# Patient Record
Sex: Female | Born: 1935 | Race: Black or African American | Hispanic: No | Marital: Single | Smoking: Never smoker
Health system: Southern US, Community
[De-identification: ages and names within clinical notes are randomized; demographics above are authoritative.]

## PROBLEM LIST (undated history)

## (undated) DIAGNOSIS — I10 Essential (primary) hypertension: Secondary | ICD-10-CM

## (undated) DIAGNOSIS — H409 Unspecified glaucoma: Secondary | ICD-10-CM

## (undated) DIAGNOSIS — K589 Irritable bowel syndrome without diarrhea: Secondary | ICD-10-CM

## (undated) DIAGNOSIS — F419 Anxiety disorder, unspecified: Secondary | ICD-10-CM

## (undated) DIAGNOSIS — D649 Anemia, unspecified: Secondary | ICD-10-CM

## (undated) DIAGNOSIS — I6990 Unspecified sequelae of unspecified cerebrovascular disease: Secondary | ICD-10-CM

## (undated) DIAGNOSIS — K210 Gastro-esophageal reflux disease with esophagitis: Secondary | ICD-10-CM

## (undated) DIAGNOSIS — J209 Acute bronchitis, unspecified: Secondary | ICD-10-CM

## (undated) DIAGNOSIS — W19XXXA Unspecified fall, initial encounter: Secondary | ICD-10-CM

## (undated) DIAGNOSIS — E559 Vitamin D deficiency, unspecified: Secondary | ICD-10-CM

## (undated) DIAGNOSIS — L97509 Non-pressure chronic ulcer of other part of unspecified foot with unspecified severity: Secondary | ICD-10-CM

## (undated) DIAGNOSIS — R627 Adult failure to thrive: Secondary | ICD-10-CM

## (undated) DIAGNOSIS — IMO0002 Reserved for concepts with insufficient information to code with codable children: Secondary | ICD-10-CM

## (undated) DIAGNOSIS — G20 Parkinson's disease: Secondary | ICD-10-CM

## (undated) DIAGNOSIS — F0150 Vascular dementia without behavioral disturbance: Secondary | ICD-10-CM

## (undated) DIAGNOSIS — E119 Type 2 diabetes mellitus without complications: Secondary | ICD-10-CM

## (undated) DIAGNOSIS — R0602 Shortness of breath: Secondary | ICD-10-CM

## (undated) HISTORY — DX: Type 2 diabetes mellitus without complications: E11.9

## (undated) HISTORY — DX: Anxiety disorder, unspecified: F41.9

## (undated) HISTORY — DX: Shortness of breath: R06.02

## (undated) HISTORY — DX: Adult failure to thrive: R62.7

## (undated) HISTORY — DX: Parkinson's disease: G20

## (undated) HISTORY — DX: Essential (primary) hypertension: I10

## (undated) HISTORY — DX: Irritable bowel syndrome without diarrhea: K58.9

## (undated) HISTORY — DX: Vitamin D deficiency, unspecified: E55.9

## (undated) HISTORY — DX: Reserved for concepts with insufficient information to code with codable children: IMO0002

## (undated) HISTORY — DX: Vascular dementia without behavioral disturbance: F01.50

## (undated) HISTORY — DX: Acute bronchitis, unspecified: J20.9

## (undated) HISTORY — DX: Unspecified sequelae of unspecified cerebrovascular disease: I69.90

## (undated) HISTORY — DX: Gastro-esophageal reflux disease with esophagitis: K21.0

## (undated) HISTORY — DX: Non-pressure chronic ulcer of other part of unspecified foot with unspecified severity: L97.509

## (undated) HISTORY — DX: Unspecified fall, initial encounter: W19.XXXA

## (undated) HISTORY — DX: Unspecified glaucoma: H40.9

## (undated) HISTORY — DX: Anemia, unspecified: D64.9

---

## 1998-03-20 ENCOUNTER — Ambulatory Visit (HOSPITAL_COMMUNITY): Admission: RE | Admit: 1998-03-20 | Discharge: 1998-03-20 | Payer: Self-pay | Admitting: Specialist

## 1998-06-22 ENCOUNTER — Other Ambulatory Visit: Admission: RE | Admit: 1998-06-22 | Discharge: 1998-06-22 | Payer: Self-pay | Admitting: Obstetrics and Gynecology

## 1999-04-09 ENCOUNTER — Encounter: Payer: Self-pay | Admitting: Emergency Medicine

## 1999-04-09 ENCOUNTER — Inpatient Hospital Stay (HOSPITAL_COMMUNITY): Admission: EM | Admit: 1999-04-09 | Discharge: 1999-04-12 | Payer: Self-pay | Admitting: Emergency Medicine

## 1999-10-09 ENCOUNTER — Encounter: Payer: Self-pay | Admitting: Physical Medicine & Rehabilitation

## 1999-10-09 ENCOUNTER — Ambulatory Visit (HOSPITAL_COMMUNITY)
Admission: RE | Admit: 1999-10-09 | Discharge: 1999-10-09 | Payer: Self-pay | Admitting: Physical Medicine & Rehabilitation

## 1999-11-07 ENCOUNTER — Encounter
Admission: RE | Admit: 1999-11-07 | Discharge: 2000-02-05 | Payer: Self-pay | Admitting: Physical Medicine & Rehabilitation

## 2000-08-15 ENCOUNTER — Encounter: Payer: Self-pay | Admitting: Physical Medicine & Rehabilitation

## 2000-08-15 ENCOUNTER — Ambulatory Visit (HOSPITAL_COMMUNITY)
Admission: RE | Admit: 2000-08-15 | Discharge: 2000-08-15 | Payer: Self-pay | Admitting: Physical Medicine & Rehabilitation

## 2000-11-05 ENCOUNTER — Other Ambulatory Visit: Admission: RE | Admit: 2000-11-05 | Discharge: 2000-11-05 | Payer: Self-pay | Admitting: *Deleted

## 2001-02-06 ENCOUNTER — Other Ambulatory Visit: Admission: RE | Admit: 2001-02-06 | Discharge: 2001-02-06 | Payer: Self-pay | Admitting: *Deleted

## 2001-12-07 ENCOUNTER — Encounter: Payer: Self-pay | Admitting: *Deleted

## 2001-12-07 ENCOUNTER — Emergency Department (HOSPITAL_COMMUNITY): Admission: EM | Admit: 2001-12-07 | Discharge: 2001-12-07 | Payer: Self-pay | Admitting: Emergency Medicine

## 2002-01-08 ENCOUNTER — Encounter
Admission: RE | Admit: 2002-01-08 | Discharge: 2002-02-01 | Payer: Self-pay | Admitting: Physical Medicine & Rehabilitation

## 2002-05-04 ENCOUNTER — Ambulatory Visit (HOSPITAL_COMMUNITY): Admission: RE | Admit: 2002-05-04 | Discharge: 2002-05-04 | Payer: Self-pay | Admitting: Pulmonary Disease

## 2002-05-04 ENCOUNTER — Encounter: Payer: Self-pay | Admitting: Pulmonary Disease

## 2002-06-10 ENCOUNTER — Inpatient Hospital Stay (HOSPITAL_COMMUNITY): Admission: EM | Admit: 2002-06-10 | Discharge: 2002-06-11 | Payer: Self-pay | Admitting: Emergency Medicine

## 2002-06-10 ENCOUNTER — Encounter: Payer: Self-pay | Admitting: Endocrinology

## 2002-06-11 ENCOUNTER — Encounter: Payer: Self-pay | Admitting: Endocrinology

## 2003-03-10 ENCOUNTER — Encounter
Admission: RE | Admit: 2003-03-10 | Discharge: 2003-06-08 | Payer: Self-pay | Admitting: Physical Medicine & Rehabilitation

## 2004-03-15 ENCOUNTER — Encounter (HOSPITAL_BASED_OUTPATIENT_CLINIC_OR_DEPARTMENT_OTHER): Admission: RE | Admit: 2004-03-15 | Discharge: 2004-06-13 | Payer: Self-pay | Admitting: Internal Medicine

## 2004-07-31 ENCOUNTER — Ambulatory Visit (HOSPITAL_COMMUNITY): Admission: RE | Admit: 2004-07-31 | Discharge: 2004-07-31 | Payer: Self-pay | Admitting: Pulmonary Disease

## 2004-09-25 ENCOUNTER — Encounter
Admission: RE | Admit: 2004-09-25 | Discharge: 2004-12-24 | Payer: Self-pay | Admitting: Physical Medicine & Rehabilitation

## 2004-09-26 ENCOUNTER — Ambulatory Visit: Payer: Self-pay | Admitting: Physical Medicine & Rehabilitation

## 2004-10-24 ENCOUNTER — Ambulatory Visit: Payer: Self-pay | Admitting: Pulmonary Disease

## 2004-11-12 ENCOUNTER — Ambulatory Visit: Payer: Self-pay | Admitting: Physical Medicine & Rehabilitation

## 2004-11-27 ENCOUNTER — Encounter: Admission: RE | Admit: 2004-11-27 | Discharge: 2004-11-27 | Payer: Self-pay | Admitting: Neurology

## 2005-02-07 ENCOUNTER — Encounter
Admission: RE | Admit: 2005-02-07 | Discharge: 2005-05-08 | Payer: Self-pay | Admitting: Physical Medicine & Rehabilitation

## 2005-02-11 ENCOUNTER — Ambulatory Visit: Payer: Self-pay | Admitting: Physical Medicine & Rehabilitation

## 2005-02-18 ENCOUNTER — Ambulatory Visit: Payer: Self-pay | Admitting: Pulmonary Disease

## 2005-08-01 ENCOUNTER — Encounter
Admission: RE | Admit: 2005-08-01 | Discharge: 2005-10-30 | Payer: Self-pay | Admitting: Physical Medicine & Rehabilitation

## 2005-08-20 ENCOUNTER — Ambulatory Visit: Payer: Self-pay | Admitting: Pulmonary Disease

## 2005-08-28 ENCOUNTER — Ambulatory Visit: Payer: Self-pay | Admitting: Physical Medicine & Rehabilitation

## 2005-11-11 ENCOUNTER — Ambulatory Visit: Payer: Self-pay | Admitting: Physical Medicine & Rehabilitation

## 2005-11-11 ENCOUNTER — Encounter
Admission: RE | Admit: 2005-11-11 | Discharge: 2006-02-09 | Payer: Self-pay | Admitting: Physical Medicine & Rehabilitation

## 2005-12-04 ENCOUNTER — Ambulatory Visit: Payer: Self-pay | Admitting: Pulmonary Disease

## 2006-02-25 ENCOUNTER — Ambulatory Visit: Payer: Self-pay | Admitting: Pulmonary Disease

## 2006-02-27 ENCOUNTER — Ambulatory Visit: Payer: Self-pay | Admitting: Physical Medicine & Rehabilitation

## 2006-02-27 ENCOUNTER — Encounter
Admission: RE | Admit: 2006-02-27 | Discharge: 2006-05-28 | Payer: Self-pay | Admitting: Physical Medicine & Rehabilitation

## 2006-05-29 ENCOUNTER — Encounter
Admission: RE | Admit: 2006-05-29 | Discharge: 2006-08-27 | Payer: Self-pay | Admitting: Physical Medicine & Rehabilitation

## 2006-05-29 ENCOUNTER — Ambulatory Visit: Payer: Self-pay | Admitting: Physical Medicine & Rehabilitation

## 2006-06-27 ENCOUNTER — Ambulatory Visit: Payer: Self-pay | Admitting: Pulmonary Disease

## 2006-08-10 ENCOUNTER — Emergency Department (HOSPITAL_COMMUNITY): Admission: EM | Admit: 2006-08-10 | Discharge: 2006-08-10 | Payer: Self-pay | Admitting: Emergency Medicine

## 2006-08-31 ENCOUNTER — Emergency Department (HOSPITAL_COMMUNITY): Admission: EM | Admit: 2006-08-31 | Discharge: 2006-08-31 | Payer: Self-pay | Admitting: Emergency Medicine

## 2006-09-08 ENCOUNTER — Ambulatory Visit: Payer: Self-pay | Admitting: Pulmonary Disease

## 2006-09-24 ENCOUNTER — Ambulatory Visit: Payer: Self-pay | Admitting: Physical Medicine & Rehabilitation

## 2006-09-24 ENCOUNTER — Encounter
Admission: RE | Admit: 2006-09-24 | Discharge: 2006-12-23 | Payer: Self-pay | Admitting: Physical Medicine & Rehabilitation

## 2006-10-10 ENCOUNTER — Ambulatory Visit: Payer: Self-pay | Admitting: Pulmonary Disease

## 2006-11-05 ENCOUNTER — Ambulatory Visit: Payer: Self-pay | Admitting: Pulmonary Disease

## 2006-11-07 ENCOUNTER — Encounter (HOSPITAL_BASED_OUTPATIENT_CLINIC_OR_DEPARTMENT_OTHER): Admission: RE | Admit: 2006-11-07 | Discharge: 2007-02-05 | Payer: Self-pay | Admitting: Surgery

## 2007-01-02 ENCOUNTER — Ambulatory Visit: Payer: Self-pay | Admitting: Vascular Surgery

## 2007-02-10 ENCOUNTER — Encounter (HOSPITAL_BASED_OUTPATIENT_CLINIC_OR_DEPARTMENT_OTHER): Admission: RE | Admit: 2007-02-10 | Discharge: 2007-05-11 | Payer: Self-pay | Admitting: Surgery

## 2007-04-01 ENCOUNTER — Ambulatory Visit (HOSPITAL_COMMUNITY): Admission: RE | Admit: 2007-04-01 | Discharge: 2007-04-01 | Payer: Self-pay | Admitting: Surgery

## 2007-05-12 ENCOUNTER — Encounter (HOSPITAL_BASED_OUTPATIENT_CLINIC_OR_DEPARTMENT_OTHER): Admission: RE | Admit: 2007-05-12 | Discharge: 2007-06-10 | Payer: Self-pay | Admitting: Surgery

## 2007-06-10 ENCOUNTER — Encounter (HOSPITAL_BASED_OUTPATIENT_CLINIC_OR_DEPARTMENT_OTHER): Admission: RE | Admit: 2007-06-10 | Discharge: 2007-08-04 | Payer: Self-pay | Admitting: Surgery

## 2007-08-17 ENCOUNTER — Encounter (HOSPITAL_BASED_OUTPATIENT_CLINIC_OR_DEPARTMENT_OTHER): Admission: RE | Admit: 2007-08-17 | Discharge: 2007-09-21 | Payer: Self-pay | Admitting: Surgery

## 2007-09-16 ENCOUNTER — Encounter: Payer: Self-pay | Admitting: Pulmonary Disease

## 2007-10-07 ENCOUNTER — Telehealth (INDEPENDENT_AMBULATORY_CARE_PROVIDER_SITE_OTHER): Payer: Self-pay | Admitting: *Deleted

## 2007-10-12 ENCOUNTER — Ambulatory Visit: Payer: Self-pay | Admitting: Pulmonary Disease

## 2007-10-12 DIAGNOSIS — K589 Irritable bowel syndrome without diarrhea: Secondary | ICD-10-CM | POA: Insufficient documentation

## 2007-10-12 DIAGNOSIS — I10 Essential (primary) hypertension: Secondary | ICD-10-CM

## 2007-10-12 DIAGNOSIS — E119 Type 2 diabetes mellitus without complications: Secondary | ICD-10-CM

## 2007-10-12 DIAGNOSIS — K219 Gastro-esophageal reflux disease without esophagitis: Secondary | ICD-10-CM

## 2007-10-12 DIAGNOSIS — M545 Low back pain, unspecified: Secondary | ICD-10-CM | POA: Insufficient documentation

## 2007-10-12 DIAGNOSIS — M1990 Unspecified osteoarthritis, unspecified site: Secondary | ICD-10-CM | POA: Insufficient documentation

## 2007-10-12 DIAGNOSIS — F411 Generalized anxiety disorder: Secondary | ICD-10-CM | POA: Insufficient documentation

## 2007-10-12 DIAGNOSIS — I872 Venous insufficiency (chronic) (peripheral): Secondary | ICD-10-CM | POA: Insufficient documentation

## 2007-10-19 LAB — CONVERTED CEMR LAB
BUN: 12 mg/dL (ref 6–23)
CO2: 28 meq/L (ref 19–32)
Calcium: 9.4 mg/dL (ref 8.4–10.5)
Chloride: 102 meq/L (ref 96–112)
Creatinine, Ser: 0.6 mg/dL (ref 0.4–1.2)
Hgb A1c MFr Bld: 7 % — ABNORMAL HIGH (ref 4.6–6.0)

## 2007-11-13 ENCOUNTER — Ambulatory Visit: Payer: Self-pay | Admitting: Pulmonary Disease

## 2007-11-13 DIAGNOSIS — E1169 Type 2 diabetes mellitus with other specified complication: Secondary | ICD-10-CM | POA: Insufficient documentation

## 2007-11-13 DIAGNOSIS — G20 Parkinson's disease: Secondary | ICD-10-CM

## 2007-11-13 DIAGNOSIS — E669 Obesity, unspecified: Secondary | ICD-10-CM

## 2007-11-13 DIAGNOSIS — N390 Urinary tract infection, site not specified: Secondary | ICD-10-CM

## 2007-11-13 LAB — CONVERTED CEMR LAB
ALT: 9 units/L (ref 0–35)
AST: 20 units/L (ref 0–37)
Basophils Relative: 0.5 % (ref 0.0–1.0)
Bilirubin, Direct: 0.1 mg/dL (ref 0.0–0.3)
CO2: 28 meq/L (ref 19–32)
Eosinophils Absolute: 0.2 10*3/uL (ref 0.0–0.6)
Eosinophils Relative: 2.6 % (ref 0.0–5.0)
GFR calc Af Amer: 123 mL/min
GFR calc non Af Amer: 101 mL/min
Glucose, Bld: 201 mg/dL — ABNORMAL HIGH (ref 70–99)
HCT: 35.2 % — ABNORMAL LOW (ref 36.0–46.0)
Hemoglobin: 12.4 g/dL (ref 12.0–15.0)
Hgb A1c MFr Bld: 6.9 % — ABNORMAL HIGH (ref 4.6–6.0)
Lymphocytes Relative: 34 % (ref 12.0–46.0)
MCV: 99.7 fL (ref 78.0–100.0)
Neutro Abs: 3.1 10*3/uL (ref 1.4–7.7)
Neutrophils Relative %: 54.8 % (ref 43.0–77.0)
Sodium: 138 meq/L (ref 135–145)
TSH: 0.95 microintl units/mL (ref 0.35–5.50)
Total Protein: 6.7 g/dL (ref 6.0–8.3)
WBC: 5.8 10*3/uL (ref 4.5–10.5)

## 2007-11-15 ENCOUNTER — Emergency Department (HOSPITAL_COMMUNITY): Admission: EM | Admit: 2007-11-15 | Discharge: 2007-11-15 | Payer: Self-pay | Admitting: Emergency Medicine

## 2007-11-17 ENCOUNTER — Ambulatory Visit: Payer: Self-pay | Admitting: Pulmonary Disease

## 2007-11-17 ENCOUNTER — Encounter (INDEPENDENT_AMBULATORY_CARE_PROVIDER_SITE_OTHER): Payer: Self-pay | Admitting: *Deleted

## 2007-11-18 ENCOUNTER — Encounter: Payer: Self-pay | Admitting: Pulmonary Disease

## 2007-11-19 ENCOUNTER — Ambulatory Visit (HOSPITAL_COMMUNITY): Admission: RE | Admit: 2007-11-19 | Discharge: 2007-11-19 | Payer: Self-pay | Admitting: Pulmonary Disease

## 2007-11-27 ENCOUNTER — Telehealth: Payer: Self-pay | Admitting: Pulmonary Disease

## 2007-12-01 ENCOUNTER — Telehealth (INDEPENDENT_AMBULATORY_CARE_PROVIDER_SITE_OTHER): Payer: Self-pay | Admitting: *Deleted

## 2007-12-08 ENCOUNTER — Ambulatory Visit: Payer: Self-pay | Admitting: Gastroenterology

## 2007-12-08 ENCOUNTER — Encounter: Payer: Self-pay | Admitting: Pulmonary Disease

## 2007-12-08 LAB — CONVERTED CEMR LAB
Albumin: 3.8 g/dL (ref 3.5–5.2)
Alkaline Phosphatase: 75 units/L (ref 39–117)
BUN: 17 mg/dL (ref 6–23)
Eosinophils Absolute: 0.1 10*3/uL (ref 0.0–0.6)
Eosinophils Relative: 1.9 % (ref 0.0–5.0)
Ferritin: 37.8 ng/mL (ref 10.0–291.0)
Folate: 7.8 ng/mL
GFR calc Af Amer: 88 mL/min
GFR calc non Af Amer: 73 mL/min
Iron: 62 ug/dL (ref 42–145)
Lymphocytes Relative: 35.5 % (ref 12.0–46.0)
MCV: 100.6 fL — ABNORMAL HIGH (ref 78.0–100.0)
Monocytes Relative: 7.3 % (ref 3.0–11.0)
Neutro Abs: 3 10*3/uL (ref 1.4–7.7)
Platelets: 220 10*3/uL (ref 150–400)
Potassium: 4 meq/L (ref 3.5–5.1)
RBC: 3.64 M/uL — ABNORMAL LOW (ref 3.87–5.11)
Saturation Ratios: 21.4 % (ref 20.0–50.0)
Sodium: 141 meq/L (ref 135–145)
TSH: 1.08 microintl units/mL (ref 0.35–5.50)
Tissue Transglutaminase Ab, IgA: 1.3 units (ref <7)
Total Bilirubin: 0.9 mg/dL (ref 0.3–1.2)
Transferrin: 206.6 mg/dL — ABNORMAL LOW (ref >=212.0)
Vitamin B-12: 347 pg/mL (ref 211–911)
WBC: 5.4 10*3/uL (ref 4.5–10.5)

## 2007-12-14 ENCOUNTER — Ambulatory Visit: Payer: Self-pay | Admitting: Pulmonary Disease

## 2007-12-18 ENCOUNTER — Ambulatory Visit: Payer: Self-pay | Admitting: Physical Medicine & Rehabilitation

## 2007-12-18 ENCOUNTER — Encounter
Admission: RE | Admit: 2007-12-18 | Discharge: 2008-01-11 | Payer: Self-pay | Admitting: Physical Medicine & Rehabilitation

## 2008-01-12 ENCOUNTER — Encounter: Payer: Self-pay | Admitting: Pulmonary Disease

## 2008-02-09 ENCOUNTER — Encounter: Payer: Self-pay | Admitting: Pulmonary Disease

## 2008-02-15 ENCOUNTER — Ambulatory Visit: Payer: Self-pay | Admitting: Pulmonary Disease

## 2008-03-09 ENCOUNTER — Telehealth (INDEPENDENT_AMBULATORY_CARE_PROVIDER_SITE_OTHER): Payer: Self-pay | Admitting: *Deleted

## 2008-04-01 ENCOUNTER — Encounter
Admission: RE | Admit: 2008-04-01 | Discharge: 2008-04-04 | Payer: Self-pay | Admitting: Physical Medicine & Rehabilitation

## 2008-04-04 ENCOUNTER — Ambulatory Visit: Payer: Self-pay | Admitting: Physical Medicine & Rehabilitation

## 2008-04-11 ENCOUNTER — Telehealth: Payer: Self-pay | Admitting: Adult Health

## 2008-05-16 ENCOUNTER — Telehealth (INDEPENDENT_AMBULATORY_CARE_PROVIDER_SITE_OTHER): Payer: Self-pay | Admitting: *Deleted

## 2008-05-19 ENCOUNTER — Telehealth: Payer: Self-pay | Admitting: Pulmonary Disease

## 2008-05-23 ENCOUNTER — Ambulatory Visit: Payer: Self-pay | Admitting: Pulmonary Disease

## 2008-05-26 ENCOUNTER — Encounter: Payer: Self-pay | Admitting: Pulmonary Disease

## 2008-05-26 LAB — CONVERTED CEMR LAB
Albumin: 3.7 g/dL (ref 3.5–5.2)
BUN: 18 mg/dL (ref 6–23)
Basophils Absolute: 0.1 10*3/uL (ref 0.0–0.1)
Basophils Relative: 0.9 % (ref 0.0–3.0)
Bilirubin Urine: NEGATIVE
Creatinine, Ser: 0.6 mg/dL (ref 0.4–1.2)
Creatinine,U: 24 mg/dL
Crystals: NEGATIVE
Eosinophils Absolute: 0 10*3/uL (ref 0.0–0.7)
GFR calc Af Amer: 122 mL/min
GFR calc non Af Amer: 101 mL/min
Hgb A1c MFr Bld: 6.4 % — ABNORMAL HIGH (ref 4.6–6.0)
Ketones, ur: NEGATIVE mg/dL
Leukocytes, UA: NEGATIVE
MCHC: 33.7 g/dL (ref 30.0–36.0)
MCV: 103.1 fL — ABNORMAL HIGH (ref 78.0–100.0)
Microalb Creat Ratio: 37.5 mg/g — ABNORMAL HIGH (ref 0.0–30.0)
Microalb, Ur: 0.9 mg/dL (ref 0.0–1.9)
Neutrophils Relative %: 67 % (ref 43.0–77.0)
Nitrite: NEGATIVE
RBC / HPF: NONE SEEN
RBC: 3.53 M/uL — ABNORMAL LOW (ref 3.87–5.11)
RDW: 11.7 % (ref 11.5–14.6)
Specific Gravity, Urine: 1.01 (ref 1.000–1.03)
Urobilinogen, UA: 0.2 (ref 0.0–1.0)

## 2008-06-07 ENCOUNTER — Telehealth (INDEPENDENT_AMBULATORY_CARE_PROVIDER_SITE_OTHER): Payer: Self-pay | Admitting: *Deleted

## 2008-06-08 ENCOUNTER — Encounter: Payer: Self-pay | Admitting: Pulmonary Disease

## 2008-06-22 ENCOUNTER — Telehealth (INDEPENDENT_AMBULATORY_CARE_PROVIDER_SITE_OTHER): Payer: Self-pay | Admitting: *Deleted

## 2008-06-27 ENCOUNTER — Telehealth (INDEPENDENT_AMBULATORY_CARE_PROVIDER_SITE_OTHER): Payer: Self-pay | Admitting: *Deleted

## 2008-07-12 ENCOUNTER — Telehealth (INDEPENDENT_AMBULATORY_CARE_PROVIDER_SITE_OTHER): Payer: Self-pay | Admitting: *Deleted

## 2008-07-13 ENCOUNTER — Encounter: Payer: Self-pay | Admitting: Pulmonary Disease

## 2008-07-18 ENCOUNTER — Encounter: Payer: Self-pay | Admitting: Pulmonary Disease

## 2008-08-05 ENCOUNTER — Telehealth: Payer: Self-pay | Admitting: Pulmonary Disease

## 2008-08-25 ENCOUNTER — Telehealth (INDEPENDENT_AMBULATORY_CARE_PROVIDER_SITE_OTHER): Payer: Self-pay | Admitting: *Deleted

## 2008-09-02 ENCOUNTER — Encounter: Payer: Self-pay | Admitting: Pulmonary Disease

## 2008-09-05 ENCOUNTER — Telehealth (INDEPENDENT_AMBULATORY_CARE_PROVIDER_SITE_OTHER): Payer: Self-pay | Admitting: *Deleted

## 2008-09-08 ENCOUNTER — Telehealth (INDEPENDENT_AMBULATORY_CARE_PROVIDER_SITE_OTHER): Payer: Self-pay | Admitting: *Deleted

## 2008-09-16 ENCOUNTER — Encounter
Admission: RE | Admit: 2008-09-16 | Discharge: 2008-09-19 | Payer: Self-pay | Admitting: Physical Medicine & Rehabilitation

## 2008-09-19 ENCOUNTER — Ambulatory Visit: Payer: Self-pay | Admitting: Physical Medicine & Rehabilitation

## 2008-09-22 ENCOUNTER — Telehealth (INDEPENDENT_AMBULATORY_CARE_PROVIDER_SITE_OTHER): Payer: Self-pay | Admitting: *Deleted

## 2008-09-26 ENCOUNTER — Ambulatory Visit: Payer: Self-pay | Admitting: Pulmonary Disease

## 2008-09-26 DIAGNOSIS — E559 Vitamin D deficiency, unspecified: Secondary | ICD-10-CM | POA: Insufficient documentation

## 2008-10-01 LAB — CONVERTED CEMR LAB
Albumin: 3.7 g/dL (ref 3.5–5.2)
Alkaline Phosphatase: 77 units/L (ref 39–117)
BUN: 19 mg/dL (ref 6–23)
Calcium: 9.4 mg/dL (ref 8.4–10.5)
GFR calc Af Amer: 122 mL/min
Glucose, Bld: 110 mg/dL — ABNORMAL HIGH (ref 70–99)
Total Protein: 7.4 g/dL (ref 6.0–8.3)

## 2008-10-04 ENCOUNTER — Telehealth (INDEPENDENT_AMBULATORY_CARE_PROVIDER_SITE_OTHER): Payer: Self-pay | Admitting: *Deleted

## 2008-10-04 ENCOUNTER — Telehealth: Payer: Self-pay | Admitting: Pulmonary Disease

## 2008-10-10 ENCOUNTER — Encounter: Payer: Self-pay | Admitting: Pulmonary Disease

## 2008-10-12 ENCOUNTER — Telehealth: Payer: Self-pay | Admitting: Pulmonary Disease

## 2008-10-12 ENCOUNTER — Emergency Department (HOSPITAL_COMMUNITY): Admission: EM | Admit: 2008-10-12 | Discharge: 2008-10-12 | Payer: Self-pay | Admitting: Emergency Medicine

## 2008-10-12 ENCOUNTER — Inpatient Hospital Stay (HOSPITAL_COMMUNITY): Admission: EM | Admit: 2008-10-12 | Discharge: 2008-10-19 | Payer: Self-pay | Admitting: Emergency Medicine

## 2008-10-12 ENCOUNTER — Ambulatory Visit: Payer: Self-pay | Admitting: Pulmonary Disease

## 2008-10-18 ENCOUNTER — Telehealth: Payer: Self-pay | Admitting: Pulmonary Disease

## 2008-11-03 DIAGNOSIS — G20A1 Parkinson's disease without dyskinesia, without mention of fluctuations: Secondary | ICD-10-CM

## 2008-11-03 DIAGNOSIS — I10 Essential (primary) hypertension: Secondary | ICD-10-CM

## 2008-11-03 DIAGNOSIS — G20 Parkinson's disease: Secondary | ICD-10-CM

## 2008-11-03 DIAGNOSIS — K589 Irritable bowel syndrome without diarrhea: Secondary | ICD-10-CM

## 2008-11-03 HISTORY — DX: Essential (primary) hypertension: I10

## 2008-11-03 HISTORY — DX: Irritable bowel syndrome, unspecified: K58.9

## 2008-11-03 HISTORY — DX: Parkinson's disease without dyskinesia, without mention of fluctuations: G20.A1

## 2008-11-03 HISTORY — DX: Parkinson's disease: G20

## 2008-11-07 ENCOUNTER — Encounter: Payer: Self-pay | Admitting: Pulmonary Disease

## 2008-11-09 ENCOUNTER — Encounter: Payer: Self-pay | Admitting: Pulmonary Disease

## 2008-11-14 ENCOUNTER — Telehealth: Payer: Self-pay | Admitting: Pulmonary Disease

## 2008-11-18 DIAGNOSIS — K2100 Gastro-esophageal reflux disease with esophagitis, without bleeding: Secondary | ICD-10-CM

## 2008-11-18 DIAGNOSIS — I6990 Unspecified sequelae of unspecified cerebrovascular disease: Secondary | ICD-10-CM

## 2008-11-18 DIAGNOSIS — R0602 Shortness of breath: Secondary | ICD-10-CM

## 2008-11-18 DIAGNOSIS — L97509 Non-pressure chronic ulcer of other part of unspecified foot with unspecified severity: Secondary | ICD-10-CM

## 2008-11-18 HISTORY — DX: Gastro-esophageal reflux disease with esophagitis, without bleeding: K21.00

## 2008-11-18 HISTORY — DX: Unspecified sequelae of unspecified cerebrovascular disease: I69.90

## 2008-11-18 HISTORY — DX: Non-pressure chronic ulcer of other part of unspecified foot with unspecified severity: L97.509

## 2008-11-18 HISTORY — DX: Shortness of breath: R06.02

## 2008-11-23 ENCOUNTER — Telehealth (INDEPENDENT_AMBULATORY_CARE_PROVIDER_SITE_OTHER): Payer: Self-pay | Admitting: *Deleted

## 2008-12-06 ENCOUNTER — Telehealth (INDEPENDENT_AMBULATORY_CARE_PROVIDER_SITE_OTHER): Payer: Self-pay | Admitting: *Deleted

## 2008-12-15 ENCOUNTER — Encounter: Payer: Self-pay | Admitting: Pulmonary Disease

## 2009-03-20 ENCOUNTER — Telehealth (INDEPENDENT_AMBULATORY_CARE_PROVIDER_SITE_OTHER): Payer: Self-pay | Admitting: *Deleted

## 2009-09-04 ENCOUNTER — Encounter: Admission: RE | Admit: 2009-09-04 | Discharge: 2009-09-04 | Payer: Self-pay | Admitting: Emergency Medicine

## 2010-01-24 ENCOUNTER — Encounter
Admission: RE | Admit: 2010-01-24 | Discharge: 2010-01-29 | Payer: Self-pay | Admitting: Physical Medicine & Rehabilitation

## 2010-01-29 ENCOUNTER — Ambulatory Visit: Payer: Self-pay | Admitting: Physical Medicine & Rehabilitation

## 2010-02-05 ENCOUNTER — Encounter: Payer: Self-pay | Admitting: Pulmonary Disease

## 2010-03-26 ENCOUNTER — Ambulatory Visit: Payer: Self-pay | Admitting: Surgery

## 2010-08-01 ENCOUNTER — Encounter
Admission: RE | Admit: 2010-08-01 | Discharge: 2010-08-01 | Payer: Self-pay | Source: Home / Self Care | Attending: Physical Medicine & Rehabilitation | Admitting: Physical Medicine & Rehabilitation

## 2010-11-25 ENCOUNTER — Encounter: Payer: Self-pay | Admitting: Pulmonary Disease

## 2010-11-25 ENCOUNTER — Encounter: Payer: Self-pay | Admitting: Neurology

## 2010-12-06 NOTE — Letter (Signed)
Summary: [facility]   Imported By: Sherian Rein 02/13/2010 07:42:29  _____________________________________________________________________  External Attachment:    Type:   Image     Comment:   External Document

## 2011-01-17 ENCOUNTER — Inpatient Hospital Stay (HOSPITAL_COMMUNITY)
Admission: EM | Admit: 2011-01-17 | Discharge: 2011-01-24 | DRG: 057 | Disposition: A | Discharge status: Skilled Nursing Facility | Payer: Medicare Other | Attending: Internal Medicine | Admitting: Internal Medicine

## 2011-01-17 ENCOUNTER — Emergency Department (HOSPITAL_COMMUNITY): Payer: Medicare Other

## 2011-01-17 DIAGNOSIS — W010XXA Fall on same level from slipping, tripping and stumbling without subsequent striking against object, initial encounter: Secondary | ICD-10-CM | POA: Diagnosis present

## 2011-01-17 DIAGNOSIS — E1142 Type 2 diabetes mellitus with diabetic polyneuropathy: Secondary | ICD-10-CM | POA: Diagnosis present

## 2011-01-17 DIAGNOSIS — D649 Anemia, unspecified: Secondary | ICD-10-CM | POA: Diagnosis present

## 2011-01-17 DIAGNOSIS — R0902 Hypoxemia: Secondary | ICD-10-CM | POA: Diagnosis present

## 2011-01-17 DIAGNOSIS — E1149 Type 2 diabetes mellitus with other diabetic neurological complication: Secondary | ICD-10-CM | POA: Diagnosis present

## 2011-01-17 DIAGNOSIS — S1093XA Contusion of unspecified part of neck, initial encounter: Secondary | ICD-10-CM | POA: Diagnosis present

## 2011-01-17 DIAGNOSIS — F0391 Unspecified dementia with behavioral disturbance: Secondary | ICD-10-CM | POA: Diagnosis present

## 2011-01-17 DIAGNOSIS — Z9181 History of falling: Secondary | ICD-10-CM

## 2011-01-17 DIAGNOSIS — R269 Unspecified abnormalities of gait and mobility: Secondary | ICD-10-CM | POA: Diagnosis present

## 2011-01-17 DIAGNOSIS — S0003XA Contusion of scalp, initial encounter: Secondary | ICD-10-CM | POA: Diagnosis present

## 2011-01-17 DIAGNOSIS — S20229A Contusion of unspecified back wall of thorax, initial encounter: Secondary | ICD-10-CM | POA: Diagnosis present

## 2011-01-17 DIAGNOSIS — K219 Gastro-esophageal reflux disease without esophagitis: Secondary | ICD-10-CM | POA: Diagnosis present

## 2011-01-17 DIAGNOSIS — G20 Parkinson's disease: Principal | ICD-10-CM | POA: Diagnosis present

## 2011-01-17 DIAGNOSIS — G20A1 Parkinson's disease without dyskinesia, without mention of fluctuations: Principal | ICD-10-CM | POA: Diagnosis present

## 2011-01-17 DIAGNOSIS — Z66 Do not resuscitate: Secondary | ICD-10-CM | POA: Diagnosis present

## 2011-01-17 DIAGNOSIS — F03918 Unspecified dementia, unspecified severity, with other behavioral disturbance: Secondary | ICD-10-CM | POA: Diagnosis present

## 2011-01-17 DIAGNOSIS — IMO0002 Reserved for concepts with insufficient information to code with codable children: Secondary | ICD-10-CM | POA: Diagnosis present

## 2011-01-17 LAB — COMPREHENSIVE METABOLIC PANEL
ALT: 23 U/L (ref 0–35)
AST: 36 U/L (ref 0–37)
Albumin: 3.4 g/dL — ABNORMAL LOW (ref 3.5–5.2)
CO2: 27 mEq/L (ref 19–32)
Calcium: 9.4 mg/dL (ref 8.4–10.5)
GFR calc Af Amer: >60 mL/min (ref >60)
Sodium: 138 mEq/L (ref 135–145)
Total Protein: 6.5 g/dL (ref 6.0–8.3)

## 2011-01-17 LAB — CBC
Hemoglobin: 10.6 g/dL — ABNORMAL LOW (ref 12.0–15.0)
MCHC: 33.5 g/dL (ref 30.0–36.0)
Platelets: 218 10*3/uL (ref 150–400)
RDW: 12.1 % (ref 11.5–15.5)

## 2011-01-17 LAB — DIFFERENTIAL
Basophils Absolute: 0 10*3/uL (ref 0.0–0.1)
Basophils Relative: 0 % (ref 0–1)
Monocytes Absolute: 0.6 10*3/uL (ref 0.1–1.0)
Neutro Abs: 3.2 10*3/uL (ref 1.7–7.7)

## 2011-01-17 LAB — URINALYSIS, ROUTINE W REFLEX MICROSCOPIC
Glucose, UA: NEGATIVE mg/dL
Specific Gravity, Urine: 1.018 (ref 1.005–1.030)
pH: 5 (ref 5.0–8.0)

## 2011-01-17 LAB — DIGOXIN LEVEL: Digoxin Level: <0.2 ng/mL — ABNORMAL LOW (ref 0.8–2.0)

## 2011-01-17 LAB — GLUCOSE, CAPILLARY

## 2011-01-18 LAB — CBC
Platelets: 190 10*3/uL (ref 150–400)
RBC: 2.86 MIL/uL — ABNORMAL LOW (ref 3.87–5.11)
WBC: 5.7 10*3/uL (ref 4.0–10.5)

## 2011-01-18 LAB — MAGNESIUM: Magnesium: 1.9 mg/dL (ref 1.5–2.5)

## 2011-01-18 LAB — BASIC METABOLIC PANEL
BUN: 22 mg/dL (ref 6–23)
Calcium: 9.1 mg/dL (ref 8.4–10.5)
Creatinine, Ser: 0.69 mg/dL (ref 0.4–1.2)
GFR calc Af Amer: >60 mL/min (ref >60)

## 2011-01-18 LAB — GLUCOSE, CAPILLARY: Glucose-Capillary: 109 mg/dL — ABNORMAL HIGH (ref 70–99)

## 2011-01-18 LAB — HEMOGLOBIN A1C
Hgb A1c MFr Bld: 7.3 % — ABNORMAL HIGH (ref <5.7)
Mean Plasma Glucose: 163 mg/dL — ABNORMAL HIGH (ref <117)

## 2011-01-18 NOTE — H&P (Signed)
[name], BLAHNIK NO.:  [id number]  MEDICAL RECORD NO.:  [id number]           PATIENT TYPE:  E  LOCATION:  WLED                         FACILITY:  [facility]  PHYSICIAN:  Vania Rea, M.D. DATE OF BIRTH:  01/11/1922  DATE OF ADMISSION:  01/17/2011 DATE OF DISCHARGE:                             HISTORY & PHYSICAL   PRIMARY CARE PHYSICIAN:  Brett Canales A. Cleta Alberts, M.D., with [facility].  ENDOCRINOLOGIST:  Reather Littler, M.D.  CARDIOLOGISTEduardo Osier Sharyn Lull, M.D.  NEUROLOGIST:  Marlan Palau, M.D.  She is also seen by Dr. Faith Rogue.  She gets home care from [facility] in [place].  CHIEF COMPLAINT:  Recurrent falls.  HISTORY OF PRESENT ILLNESS:  This is an 77 year old African American lady who lives alone and reportedly has some history of dementia was brought to the emergency room by relatives with a history of recurrent falls and inability to continue to take care of herself at home.  Thepatient does have a history of dementia, was evaluated by the emergency room physician, found to have bruising around the right eye, but no acute severe abnormalities, but because of the patient's gait instability, the fact that she has no recollection of falling, and that she lives alone, the Hospitalist Service was called to assist with management.  The patient absolutely denies falling.  When questioned about the bruises on her back, abrasions on her leg, a bruise around her right eye, she says this is most caused by people hitting her, but the eye was caused by getting it squeezed because she squeezed it on a pillow.  Other than this, we are unable to get a clear history as the family have now left.  PAST MEDICAL HISTORY:  Diabetes, hypertension, dementia, anxiety, diabetic neuropathy, Parkinson disease, GERD, past history of a stroke.  MEDICATIONS: 1. Xanax 0.25 mg daily. 2. Aspirin 81 mg daily. 3. Carbidopa/levodopa 25/100 half tablet 3  times daily. 4. Lisinopril/HCTZ 10/12.5 one tablet daily. 5. Multivitamin chews one daily. 6. Vitamin D3 2000 units daily. 7. NovoLog 70/30 mix 10-16 units per day via sliding scale.  ALLERGIES:  No known drug allergies.  SOCIAL HISTORY:  No history of tobacco, alcohol, or illicit drug use. She is retired from working many years in the [employer].  FAMILY HISTORY:  Significant for diabetes and coronary artery disease.  REVIEW OF SYSTEMS:  Significant only for the patient walks with walker. Lives alone.  She is incontinent of urine and wears diapers.  PHYSICAL EXAMINATION:  GENERAL:  A very pleasant elderly Caucasian lady reclining in the stretcher, oriented to person. VITAL SIGNS:  Her temperature is 98.6, pulse 80, respirations 16, blood pressure 149/61, she is saturating at 95% on room air. HEENT:  Her pupils are round and equal.  She is status post surgery on the right pupil.  Mucous membranes pink.  Anicteric. NECK:  No cervical lymphadenopathy.  No thyromegaly.  She has bilateral carotid bruits. CHEST:  Clear to auscultation bilaterally. CARDIOVASCULAR SYSTEM:  Regular rhythm.  She has a 2/6 murmur over the precordium. ABDOMEN:  Obese, but soft.  There are no  masses.  Normal abdominal bowel sounds.  No murmurs.  She has a large pannus, but there is no intertriginous Candida. EXTREMITIES:  She has marked arthritic deformities of the hands, knees, ankles.  She has edema of both lower extremities and a healing wound just above her left ankle.  The toes are pink, but she does have past evidence of healed diabetic foot infections. SKIN:  She has multiple small fresh bruises in her upper and lower back. She has right supraorbital hematoma involving the eyelid and some bruising over the right temple area of the right eye. CENTRAL NERVOUS SYSTEM:  Cranial nerves III through XII are grossly intact and within limits of the exam.  She has no focal lateralizing signs.  Gait was not  tested.  She has a coarse tremor of both upper extremities at rest, typical of Parkinson's.  LABORATORY DATA:  Her white count is 5.7, hemoglobin 10.6, platelets 218, she has a normal differential.  Her sodium is 138, potassium 4.0, chloride 104, CO2 of 27, glucose 147, BUN 31, creatinine 0.7.  Her calcium was 9.4, albumin slightly low at 3.4.  Her liver functions are completely normal.  Her PT is 15.9 with an INR of 1.25, her PTT is 32. Urinalysis was clear and completely normal.  Digoxin level was undetectable.  EKG shows normal sinus rhythm with no ST-segment abnormalities.  ASSESSMENT: 1. Gait abnormality with a history of recurrent falls as evidenced by     right eye hematoma and bruising of the back and abrasion on the     left leg. 2. Diabetes type 2. 3. Hypertension. 4. Dementia. 5. Parkinson disease. 6. Diabetic neuropathy. 7. Dementia.  PLAN: 1. We will admit this lady for physical therapy and evaluation by the     social worker.  We will continue her home medications, but will     initially place her on sliding scale insulin until we can determine     the best way to manage her diabetes.  She does not have a     leukocytosis, fever, or any signs of infection, although she does     have the     wound on her foot.  We do not think antibiotics therapy is     indicated at this time.  She will, however, need protection of the     feet because of her neuropathy and the swelling. 2. Other plans as per orders.     Vania Rea, M.D.     LC/MEDQ  D:  01/17/2011  T:  01/17/2011  Job:  [id number]  cc:   Brett Canales A. Cleta Alberts, M.D. Fax: [phone]  Reather Littler, M.D. Fax: [phone]  Electronically Signed by Vania Rea M.D. on 01/18/2011 02:24:45 AM

## 2011-01-19 ENCOUNTER — Inpatient Hospital Stay (HOSPITAL_COMMUNITY): Payer: Medicare Other

## 2011-01-20 LAB — GLUCOSE, CAPILLARY
Glucose-Capillary: 162 mg/dL — ABNORMAL HIGH (ref 70–99)
Glucose-Capillary: 237 mg/dL — ABNORMAL HIGH (ref 70–99)
Glucose-Capillary: 149 mg/dL — ABNORMAL HIGH (ref 70–99)

## 2011-01-20 LAB — CBC
HCT: 27.8 % — ABNORMAL LOW (ref 36.0–46.0)
MCV: 99.3 fL (ref 78.0–100.0)
Platelets: 166 10*3/uL (ref 150–400)
RBC: 2.8 MIL/uL — ABNORMAL LOW (ref 3.87–5.11)
WBC: 6 10*3/uL (ref 4.0–10.5)

## 2011-01-20 LAB — BASIC METABOLIC PANEL
BUN: 22 mg/dL (ref 6–23)
Chloride: 107 mEq/L (ref 96–112)
Potassium: 4 mEq/L (ref 3.5–5.1)
Sodium: 138 mEq/L (ref 135–145)

## 2011-01-21 LAB — GLUCOSE, CAPILLARY
Glucose-Capillary: 193 mg/dL — ABNORMAL HIGH (ref 70–99)
Glucose-Capillary: 177 mg/dL — ABNORMAL HIGH (ref 70–99)
Glucose-Capillary: 124 mg/dL — ABNORMAL HIGH (ref 70–99)

## 2011-01-22 LAB — GLUCOSE, CAPILLARY
Glucose-Capillary: 195 mg/dL — ABNORMAL HIGH (ref 70–99)
Glucose-Capillary: 118 mg/dL — ABNORMAL HIGH (ref 70–99)
Glucose-Capillary: 208 mg/dL — ABNORMAL HIGH (ref 70–99)
Glucose-Capillary: 68 mg/dL — ABNORMAL LOW (ref 70–99)
Glucose-Capillary: 91 mg/dL (ref 70–99)
Glucose-Capillary: 185 mg/dL — ABNORMAL HIGH (ref 70–99)

## 2011-01-23 DIAGNOSIS — F0280 Dementia in other diseases classified elsewhere without behavioral disturbance: Secondary | ICD-10-CM

## 2011-01-24 LAB — GLUCOSE, CAPILLARY
Glucose-Capillary: 147 mg/dL — ABNORMAL HIGH (ref 70–99)
Glucose-Capillary: 227 mg/dL — ABNORMAL HIGH (ref 70–99)
Glucose-Capillary: 126 mg/dL — ABNORMAL HIGH (ref 70–99)

## 2011-01-26 NOTE — Consult Note (Signed)
[name], [name]             ACCOUNT NO.:  [id number]  MEDICAL RECORD NO.:  [id number]           PATIENT TYPE:  I  LOCATION:  1504                         FACILITY:  [facility]  PHYSICIAN:  Eulogio Ditch, MD DATE OF BIRTH:  1922-07-29  DATE OF CONSULTATION: DATE OF DISCHARGE:                                CONSULTATION   REASON FOR CONSULTATION:  Evaluation for capacity.  HISTORY OF PRESENT ILLNESS:  I saw the patient and reviewed the medical records.  The patient is an 77 year old African-American female who has a history of dementia and number of other medical problems including parkinsonism disease, diabetes, hypertension, diabetic neuropathy, GERD and a past history of stroke.  The patient is on carbidopa-levodopa for Parkinson's disease and hydrochlorothiazide/lisinopril for hypertension. She is also on NovoLog for her diabetes.  I have reviewed her past records and I saw the consult done by Dr. Jeanie Sewer in the past.  It wasdocumented at that time the daughter does not want to take care of the patient and she do not have other family members who are taking care of the patient.  Her dementia was worsening it.  The patient also has a pattern of rejecting medications and becoming agitated and combative at certain times.  The patient was unable to tell me what medical problems she has and what medication she takes and how she takes those medications.  The patient is alert, awake, oriented to time, place and person.  Her memory is poor.  Attention and concentration is fair. Abstraction ability is poor.  Funds of knowledge poor.  Insight and judgment is poor.  She denies any psychotic or manic symptoms.  She is not internally preoccupied.  The patient was pleasant on approach. Hygiene, grooming is poor.  Her nails are dirty and she has tremors in the hands.  Her speech is slurred and garbled.  On asking how she takes care of herself, she told me that her friend sometimes  check on her. Physical Therapy has also seen the patient and they recommended Nursing Home Facility.  When I asked the patient about recurrent falls, she told me the people lie on her, she do not fall.  Keeping in mind number of factors: 1. Her dementia getting worse, she has diabetes, hypertension and     Parkinson's disease. 2. She lives alone. 3. Pattern of refusing the medications. 4. Recurrent falls.  The patient is danger to herself, keeping in mind all these factors.  If the patient has a legal guardian already, then legal guardian can make decision for the patient for the placement.  If not, the patient needs a legal guardian.  At this time, the patient do not have capacity and the competency decision can be got from the judge and legal guardian can be appointed.  Family should also be involved in this process.  The patient will be safe at this time to be transferred to SNF and process of getting guardianship can be done from there and it can be started while she is in the hospital.  DIAGNOSES:  AXIS I:  Dementia, not otherwise specified. AXIS II:  Deferred. AXIS III:  See medical notes. AXIS IV:  Chronic medical and mental issues. AXIS V:  40 to 50.  RECOMMENDATIONS:  As discussed in HPI, the patient do not have capacity at this time to make decisions for the placement.  Thanks for involving me in taking care of this patient.     Eulogio Ditch, MD     SA/MEDQ  D:  01/23/2011  T:  01/23/2011  Job:  [id number]  Electronically Signed by Eulogio Ditch  on 01/26/2011 07:18:09 AM

## 2011-02-03 NOTE — Discharge Summary (Signed)
[name], [name]             ACCOUNT NO.:  [id number]  MEDICAL RECORD NO.:  [id number]           PATIENT TYPE:  I  LOCATION:  1504                         FACILITY:  [facility]  PHYSICIAN:  Norma Scott, MD     DATE OF BIRTH:  1922/10/05  DATE OF ADMISSION:  01/17/2011 DATE OF DISCHARGE:                        DISCHARGE SUMMARY - REFERRING   PRIMARY CARE PHYSICIAN:  [name], M.D.  ENDOCRINOLOGIST:  [name], M.D.  CARDIOLOGISTEduardo Osier Sharyn [name], M.D.  NEUROLOGIST:  [name], M.D.  DISCHARGE DIAGNOSES: 1. Gait ataxia with recurrent falls. 2. Parkinson's disease. 3. Dementia. 4. Insulin-dependent diabetes mellitus with resolved hypoglycemia. 5. Hypertension. 6. The patient lacks medical decision-making capacity. 7. No code blue. 8. Gastroesophageal reflux disease. 9. Anemia.  DISCHARGE MEDICATIONS: 1. NovoLog sliding scale subcutaneously t.i.d. with meals as follows.     For CBG less than 70 mg/dL, implement hypoglycemia protocol, 70 to     120 mg/dL no insulin, 161 to 096 mg/dL 1 unit, 045 to 409 mg/dL 2     units, 811 to 914 mg/dL 3 units, 782 to 956 mg/dL 5 units, 213 to     086 mg/dL 7 units, 578 to 469 mg/dL 9 units, greater than 629 mg/dL     call MD. 2. Enteric-coated aspirin 81 mg p.o. daily. 3. Carbidopa/levodopa 25/100 mg tablet, half tablet p.o. t.i.d. 4. Lisinopril/hydrochlorothiazide 10/12.5 mg 1 tablet p.o. daily. 5. Multivitamins to chew 1 tablet p.o. daily. 6. Vitamin D3 2000 international units 1 tablet p.o. daily.  DISCONTINUED MEDICATIONS:  NovoLog mix 70/30 insulin.  IMAGING: 1. Chest x-ray on January 19, 2011.  Impression, no active     cardiopulmonary disease. 2. CT of the head without contrast, January 17, 2011.  Impression, no     skull fracture or intracranial hemorrhage.  Small vessel disease     type changes.  Small age-indeterminate left thalamic infarct.     Global atrophy.  Vascular calcifications.  PERTINENT LABS:   Basic metabolic panel on March 18 only significant for glucose of 154.  BUN was 22, creatinine 0.67.  CBC, hemoglobin 9.5, hematocrit 28, white blood cells 6, platelets 166,000, BNP 120, hemoglobin A1c 7.3, magnesium 1.9, digoxin level less than 0.2. Urinalysis is negative for features of urinary tract infection. Coagulation indices unremarkable.  Hepatic panel only significant for albumin of 3.4.  CONSULTATIONS:  Psychiatry, Norma Ditch, MD.  DIET:  Carb-modified medium diet.  ACTIVITY:  Out of bed with assist and increase activities slowly.  HISTORY:  Complaints today in "I want to go home."  According to the patient's nursing, the patient was slightly agitated yesterday but this resolved overnight.  There are no other active issues.  The patient did not require any medications for the agitation and she settled by herself.  PHYSICAL EXAMINATION:  GENERAL:  The patient is sitting on a chair comfortably.  She declines lying on the bed.  She is in no obvious distress. VITAL SIGNS:  Temperature 98.5 degrees Fahrenheit, pulse 119 per minute and regular, respirations 16 per minute, blood pressure 93/60 mmHg and saturating at 99% on room air. RESPIRATORY:  Clear.  No increased workup breathing. CARDIOVASCULAR:  First and second heart sounds heard, regular.  No JVD or murmur. ABDOMEN:  Nondistended, nontender, soft and bowel sounds present. CENTRAL NERVOUS SYSTEM:  Patient is awake, alert, oriented to self and partly to place but no focal neurological deficits. EXTREMITIES:  With grade 5/5 power in all limbs.  Intermittent mild parkinsonian tremor of both upper extremities.  HOSPITAL COURSE:  [name] is an 77 year old African-American female patient who lives alone but has caregivers who spend few hours a day with her.  She has history of recurrent falls and is unable to take care of herself at home.  She does not recall falling.  She was brought to the Emergency Room by  relatives with history of recurrent falls and inability to continue to take care of herself and failure to thrive at home environment.  The [facility] were requested to admit for further evaluation and management. 1. Gait ataxia with recurrent falls.  This is probably multifactorial     secondary to the patient's advanced age, chronic altered mental     status, parkinsonism.  It was determined that it is unsafe for the     patient to return home to live by herself.  She, however, insisted     on going home and not to a skilled nursing facility.  The patient,     however, has dementia and did not seem to have the insight or the     cognition to make a safe decision.  A psychiatric consultation was     requested and was kindly provided by Dr. Rogers [name].  He determined     that her dementia was worsening and she has a pattern of rejecting     medications and becoming agitated and combative at times.  Her     responses to his questions were also inappropriate.  He indicated     that the patient is danger to herself and keeping in mind all the     factors reviewed, he determined that the patient has no capacity to     make decisions for placement.  At this time, her caregiver which is     her niece, [name], was contacted and she agreed that the safest     option for the patient would be a skilled nursing facility     placement.  At the facility, the process of obtaining a     guardianship can be pursued as deemed necessary. 2. Uncontrolled type 2 diabetes with hypoglycemia episode.  This may     be secondary to fluctuating and poor oral intake.  Her NovoLog mix     insulin has been stopped and she has been switched to sensitive     sliding scale insulin.  This can be further adjusted based on her     CBG checks. 3. Parkinson's disease.  The patient is to continue her carbidopa-     levodopa and consider outpatient followup with her neurologist. 4. Hypertension.  Her blood pressures are  mostly controlled in the 100     to 130 over 60s to 70s.  She has an occasional drop in blood     pressure in the 90s or 80s over 40s to 60s but she is asymptomatic     of this.  If her blood pressures persist to be low, then consider     reducing her antihypertensive medications. 5. There is a question of hypoxia, however, the patient is currently  saturating in the high 90s on room air and does not look to be in     any distress. 6. Anemia, stable and asymptomatic.  Outpatient workup as deemed     necessary. 7. No code blue.  DISPOSITION:  The patient is discharged to a skilled nursing facility in stable condition.  FOLLOWUP RECOMMENDATIONS:  Recommend repeating her CBC and basic metabolic panel in a week from discharge.  Time taken in coordinating this discharge was 35 minutes.     Norma Scott, MD     AH/MEDQ  D:  01/24/2011  T:  01/24/2011  Job:  [id number]  cc:   [name], M.D. Fax: [phone]  [name], M.D. Fax: [phone]  Norma Osier. Sharyn [name], M.D. Fax: [phone]  C. Lesia Sago, M.D. Fax: [phone]  Electronically Signed by Norma Scott MD on 02/03/2011 10:47:57 PM

## 2011-02-11 NOTE — Discharge Summary (Signed)
[name]             ACCOUNT NO.:  [id number]  MEDICAL RECORD NO.:  [id number]           PATIENT TYPE:  I  LOCATION:  1504                         [facility]AC[facility]  PHYSICIAN:  Erick Blinks, MD     DATE OF BIRTH:  02-15-1922  DATE OF ADMISSION:  01/17/2011 DATE OF DISCHARGE:                        DISCHARGE SUMMARY - REFERRING   PRIMARY CARE PHYSICIAN:  Brett Canales A. Cleta Alberts, MD  DISCHARGE DIAGNOSES: 1. Unsteady gait with recurrent falls. 2. Parkinson disease. 3. Dementia. 4. Insulin-dependent diabetes. 5. Hypertension 6. Diabetic neuropathy.  DISCHARGE MEDICATIONS: 1. Lisinopril/hydrochlorothiazide 10/12.5 mg 1 tablet p.o. daily. 2. Enteric-coated aspirin 81 mg p.o. daily. 3. Multivitamins 1 tablet p.o. daily. 4. Vitamin D3 2000 international units 1 tablet p.o. daily. 5. Carbidopa/Levodopa 25/100 mg 1/2 tablet p.o. t.i.d. 6. NovoLog mix 70/30, 16 units in the morning if blood sugar was     greater than 120, 12 units in the morning if blood sugar less than     120, and 10 units every night.  ADMISSION HISTORY:  This is an 77 year old African American female who lives at home alone and has caregivers that spend few hours a day with her.  The patient has a history of recurrent falls and is unable to take care of herself at home.  The patient does not recall falling.  The patient was unsafe to be discharged home by herself secondary to her gait abnormality and unsteadiness.  Therefore, she was admitted for placement.  For further details, please refer the history and physical dictated by Dr. Orvan Falconer on March 15.  HOSPITAL COURSE: 1. Gait instability.  The patient was seen by Physical Therapy and     recommended skilled nursing facility placement for short-term     rehabilitation.  The patient is agreeable for this.  We will have     Social Work to arrange for placement at a facility. 2. Parkinson disease.  The patient continued on her outpatient      medication. 3. Insulin-dependent diabetes.  Her sugars have remained stable. 4. Remainder the patient's medical issues have remained stable. 5. Hypoxia.  The patient was noted to be hypoxic requiring oxygen.     Her oxygen saturations were down the 80s and she required 3 L of     oxygen.  On repeat check, her oxygen was subsequently discontinued     and her oxygen levels were checked and were found to be in the mid     to high 90s.  The initial reading may have been an error.  Her     chest x-ray was otherwise negative.  Her BN peptide was 120.  She     does not clinically feel short of breath or having any complaints.  CONSULTATIONS:  None.  PROCEDURES:  None.  DIAGNOSTIC IMAGING: 1. CT head on March 15 shows no skull fracture or intracranial     hemorrhage, small-vessel disease type changes. 2. Chest x-ray from March 17 shows no active cardiopulmonary disease.  DISCHARGE INSTRUCTIONS:  The patient should follow up with primary care physician when she is discharged from the facility.  She will conduct her activity  as tolerated.  Continue on a low-calorie, low-salt diet. Condition at time of discharge is improved.     Erick Blinks, MD     JM/MEDQ  D:  01/20/2011  T:  01/20/2011  Job:  [id number]  Electronically Signed by Durward Mallard MEMON  on 02/11/2011 02:12:01 PM

## 2011-02-26 DIAGNOSIS — F0150 Vascular dementia without behavioral disturbance: Secondary | ICD-10-CM

## 2011-02-26 HISTORY — DX: Vascular dementia, unspecified severity, without behavioral disturbance, psychotic disturbance, mood disturbance, and anxiety: F01.50

## 2011-03-19 NOTE — Assessment & Plan Note (Signed)
[facility]   [name], [name]             ACCOUNT NO.:  [id number]   MEDICAL RECORD NO.:  [id number]      DATE OF BIRTH:  May 08, 1922   PHYSICIAN:  Theresia Majors. Tanda Rockers, M.D. VISIT DATE:  06/02/2007                                   OFFICE VISIT   SUBJECTIVE:  [name] is an 77 year old lady who we are following  for a Wagner grade 3 diabetic foot ulcer involving the left hallux.  We  have treated her with hyperbaric oxygen treatment and an Apligraf.  We  have recommended that we continue her on Regranex but the family has  declined to procure this drug for financial reasons.  She returns for  wound evaluation.  There has been no interim pain, excessive drainage or  malodor.  The patient has increased her ambulation.   OBJECTIVE:  Blood pressure is 115/45, respirations are 16, pulse rate of  50, temperature 97.6.  Capillary blood glucose is 102 following HBO.  Inspection of the left hallux shows dramatic improvement with 100%  healthy-appearing granulation and advancing epithelium.  There is no  excessive drainage.  The periwound environment is conducive to healing.  There is no evidence of ascended infection or ischemic compromise.   ASSESSMENT:  Clinical response to hyperbaric oxygen.   PLAN:  We will request a reauthorization for 30 additional dives for HBO  and continue moist moist dressing daily.      Harold A. Tanda Rockers, M.D.  Electronically Signed     HAN/MEDQ  D:  06/02/2007  T:  06/03/2007  Job:  [id number]

## 2011-03-19 NOTE — Assessment & Plan Note (Signed)
[facility]   NAMEARTHELLA, HEADINGS             ACCOUNT NO.:  [id number]   MEDICAL RECORD NO.:  [id number]      DATE OF BIRTH:  1922/10/04   PHYSICIAN:  Maxwell Caul, M.D. VISIT DATE:  06/16/2007                                   OFFICE VISIT   REASON FOR VISIT:  Ms. Dileo was seen today post HBO dive.  She has  continued to be treated for a Wagner's grade 3 diabetic foot ulcer  involving the left hallux.  She has continued with hyperbaric oxygen.  She has had an Apligraf placed.  She has had financial difficulties  obtaining Regranex.  Most recently she has been followed with moist-to-  moist dressing changes which we are changing daily.   WOUND EXAM:  Current dimensions are 4.3 x 0.9 x 0.2.  This is healthy  granulation tissue, some epithelization present.  The dimensions of the  wound have contracted somewhat over the last month.  There is no  evidence of surrounding infection.  Nothing needed debridement.   IMPRESSION:  Wagner's grade 3 diabetic foot ulcer.  We are continuing  with wet-to-wet dressings and hyperbaric oxygen treatment.  She will be  seen in conjunction with hyperbaric oxygen next week.           ______________________________  Maxwell Caul, M.D.     MGR/MEDQ  D:  06/16/2007  T:  06/17/2007  Job:  [id number]
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
## 2011-03-19 NOTE — Discharge Summary (Signed)
[name], TSAN             ACCOUNT NO.:  [id number]   MEDICAL RECORD NO.:  [id number]          PATIENT TYPE:  INP   LOCATION:  6713                         FACILITY:  [facility]   PHYSICIAN:  Lonzo Cloud. Kriste Basque, MD     DATE OF BIRTH:  1922/04/05   DATE OF ADMISSION:  10/12/2008  DATE OF DISCHARGE:                               DISCHARGE SUMMARY   ADDENDUM:   FINAL DIAGNOSES:  1. Admitted October 12, 2008, with urinary tract infection, nausea,      vomiting and failure to thrive at home.  Urine culture revealed      multiple species and she responded to IV Rocephin and was switched      to oral Cipro with improvement.  2. Hypertension - medications adjusted this admission.  3. Insulin-dependent diabetes mellitus - insulin adjusted this      admission.  4. History of obesity - the patient's weight has improved over the      last year.  5. History of venous insufficiency and diabetic foot ulcer.  6. History of gastroesophageal reflux disease.  7. History of irritable bowel syndrome.  8,  Degenerative arthritis.  1. History of low back pain syndrome.  2. History of vitamin D deficiency.  3. Severe Parkinson's disease - she has marked manifestations of      significant Parkinson's syndrome.  She was previously on Sinemet,      which she stopped on her own and refuses to restart or follow-up      with neurology.  4. Known cerebrovascular disease with old lacunar infarct on right and      small-vessel ischemic changes.  5. History of anxiety.  6. Dementia - not otherwise specified with psychiatric evaluation by      Dr. Jeanie Sewer indicating that she lacks the capacity for informed      consent.   HISTORY AND PHYSICAL:  Please see the discharge summary of October 18, 2008, for details.   MEDICATIONS AT DISCHARGE:  1. Mixed insulin 75/25 fourteen units in the morning and 10 units at      dinner.  2. Enteric-coated aspirin 81 mg p.o. daily.  3. Metoprolol 25 mg tablets 1/2  tablet p.o. b.i.d.  4. Diovan 80 mg tablets 1/2 tablet p.o. q.a.m.  5. Alprazolam 0.25 mg 1 tablet p.o. t.i.d. as needed for anxiety.  6. MiraLax 17 grams in water daily.  7. Senokot-S 1-2 tablets p.o. nightly to ensure soft regular bowel      movements.  8. A 1500 calorie no concentrated sweets soft diet with help with each      meal to ensure adequate intake.  9. Extra water during the day with help drinking under supervision.  10.Continued physical therapy and occupational therapy in the      outpatient setting.   CONDITION ON DISCHARGE:  Improved.   FINAL DISPOSITION:  The patient will be discharged to [facility] for ongoing care.      Lonzo Cloud. Kriste Basque, MD  Electronically Signed     SMN/MEDQ  D:  10/19/2008  T:  10/19/2008  Job:  [id number]

## 2011-03-19 NOTE — Assessment & Plan Note (Signed)
[facility]   NAMEKAYNA, SUPPA             ACCOUNT NO.:  [id number]   MEDICAL RECORD NO.:  [id number]      DATE OF BIRTH:  10/02/1922   PHYSICIAN:  Theresia Majors. Tanda Rockers, M.D. VISIT DATE:  06/09/2007                                   OFFICE VISIT   SUBJECTIVE:  Ms. Venier is an 77 year old lady with a Wagner grade 3  diabetic foot ulcer.  She is undergoing hyperbaric oxygen treatment.  She is on dive 3 of an ordered total order treatments of 30.  There is  been no interim excessive drainage, malodor, pain or fever.   OBJECTIVE:  VITAL SIGNS:  Blood pressure is 142/59, respirations are  100, respirations are 16, pulse rate of 100, temperature is 97.6.  Capillary blood glucose is 167 mg % post dive.  Inspection of the wound  of the hallux shows that there is 100% granulation which is healthy in  appearance with no exudates.  No inflammatory changes or  evidence of  infection.   ASSESSMENT:  Clinical response to HBO.   PLAN:  We will continue hyperbaric oxygen.  In the interim we will apply  a moist dressing.      Harold A. Tanda Rockers, M.D.  Electronically Signed     HAN/MEDQ  D:  06/09/2007  T:  06/09/2007  Job:  [id number]

## 2011-03-19 NOTE — Assessment & Plan Note (Signed)
[facility]   NAME:  GENEVIEVE, ARBAUGH             ACCOUNT NO.:  [id number]   MEDICAL RECORD NO.:  [id number]      DATE OF BIRTH:  08-22-1922   PHYSICIAN:  Theresia Majors. Tanda Rockers, M.D. VISIT DATE:  07/15/2007                                   OFFICE VISIT   SUBJECTIVE:  Ms. Chahal is an 77 year old lady who is undergoing  hyperbaric oxygen treatment for Wagner grade 3, diabetic foot ulcer  involving the left foot.  She denies complications related to HBO.  She  continues to manage her diabetes per her primary care physician.  There  has been no excessive drainage, malodor pain or fever of the wound.   OBJECTIVE:  VITAL SIGNS:  Blood pressure is 127/78, respirations 16,  pulse rate 93, temperature is 97.8.  EXTREMITIES:  Inspection of the left foot shows that there is 100%  granulation with continued contraction and decreased volume.  The wound  was measured, photographed and cataloged.  Please refer to the data  entry.   ASSESSMENT:  Clinical improvement.   PLAN:  We will continue her HBO treatment.  Continue the moist dressings  and antiseptic soap washing.  We will re-evaluate the wound in 5 days.      Harold A. Tanda Rockers, M.D.  Electronically Signed     HAN/MEDQ  D:  07/15/2007  T:  07/16/2007  Job:  [id number]

## 2011-03-19 NOTE — Assessment & Plan Note (Signed)
[facility]                         GASTROENTEROLOGY OFFICE NOTE   [name], [name]                      MRN:          [id number]  DATE:12/08/2007                            DOB:          Mar 30, 1922    CONTINUATION OFFICE VISIT   SOCIAL HISTORY:  The patient lives by herself and fixes her own meals.  She has a 5th grade education.  She does not smoke or use ethanol.   REVIEW OF SYSTEMS:  GENITOURINARY:  The patient is chronically  incontinent of urine and wears Depends.  VASCULAR:  She has peripheral  vascular disease of the lower extremities.  She, however, besides the  Parkinson's disease, has no NEUROPSYCHIATRIC problems and has a good and  intact memory.  CARDIOVASCULAR/PULMONARY:  She denies current  cardiovascular or pulmonary complaints.  Her review of systems is  otherwise noncontributory.   PHYSICAL EXAMINATION:  GENERAL APPEARANCE:  She is an elderly appearing  black female appearing older than her stated age with a chronic resting  tremor.  VITAL SIGNS:  She is 5 feet tall and weighs 136 pounds.  Blood pressure  119/44 and pulse was 80 and regular.  CHEST:  Generally clear.  She appeared to be in a regular rhythm without  murmurs, rubs or gallops.  ABDOMEN:  I could not appreciate hepatosplenomegaly, abdominal masses or  tenderness.  Bowel sounds were normal.  I could not appreciate a  succussion splash.  RECTAL:  Rectal exam showed hard balls of stool in the rectum without  definite impaction.  The stool was guaiac negative.  PSYCHIATRIC:  Mental status was intact.   ASSESSMENT:  1. Anorexia and weight loss all related to her social situation and      her poorly controlled diabetes.  2. Probable element of gastroesophageal reflux disease.  3. Chronic functional constipation, perhaps contributing to some of      her gastrointestinal symptomatology.  4. Insulin-dependent diabetes mellitus with possible low grade gut  enteropathy, although this has not been confirmed by emptying scan.  5. Parkinson's disease on Carbidopa therapy.  6. Intermittent nausea and vomiting directly related to hypoglycemia      related to poor food intake and insulin dosages.  7. Hypertensive cardiovascular disease.  8. Degenerative arthritis and chronic back pain.  9. Chronic urinary incontinence with recurrent urinary tract      infections.  10.History of previous lacunar infarcts on MRI of the head.  11.History of peripheral vascular disease and diabetic ischemia to her      feet.   RECOMMENDATIONS:  1. I have discussed with her niece about the patient perhaps being      placed in long-term care but apparently the patient has refused      such and also refuses Meals on Wheels.  2. Empirically placed on Prevacid 30 mg q.a.m., 30 minutes before      breakfast.  3. MiraLax in 8 ounces on a regular basis at bedtime.  4. Repeat laboratory tests including thyroid functions and anemia      profile.  5. Ongoing dietary and insulin control of her diabetes per  Dr. Kriste Basque      and Dr. Lucianne Muss.   ADDENDUM:  I did check a celiac panel because the patient is diabetic,  although I think it is unlikely that she has celiac disease.  I really  see no need to go into a long and involved gastrointestinal endoscopic  evaluation or malabsorption evaluation since I think a large part of her  problems are social situation-related.  The niece asked about Ensure and  I see no problems with her using Ensure twice a day as a dietary  supplement.  Again, I think long-term nursing home care would be best  for this patient to better control her nutritional management and  insulin therapy.     Vania Rea. Jarold Motto, MD, Caleen Essex, FAGA     DRP/MedQ  DD: 12/08/2007  DT: 12/09/2007  Job #: [id number]   cc:   Lonzo Cloud. Kriste Basque, MD  Reather Littler, M.D.

## 2011-03-19 NOTE — Assessment & Plan Note (Signed)
[name] is back regarding her osteoarthritis of her knees.  Left knee  seems to be acting up more over the last few weeks where her knee has  been buckling and paining her when she walks.  She has had some problems  with some recent wounds on her feet as well and associated swelling.  She states her pain in the left knee is 8/10 when walking.  Pain  interferes with general activity, relations with others, and enjoyment  of life on a moderate level.  Sleep is good.   REVIEW OF SYSTEMS:  Notable for tremor, trouble walking, some anxiety,  high sugars, constipation, and decreased appetite.  She also reports  anxiety.   SOCIAL HISTORY:  The patient lives alone.   PHYSICAL EXAMINATION:  VITAL SIGNS:  Blood pressure 147/63, pulse 82,  respiratory rate 18, and she is sating 98% on room air.  GENERAL:  The patient is generally pleasant, alert and oriented x3.  She  has a resting, almost pill-rolling tremor in both hands.  She has fair  passive and active range of motion of the knees.  Left knee is painful,  however, with resistance.  No frank crepitus is appreciated.  Right  first toe has some old scarring.  She was in the wheelchair today.  We  did not walk her.  Cognition was fair although she seemed to be a bit  more distracted and tangential in her thought processing today.   ASSESSMENT:  1. Bilateral osteoarthritis of the knees.  2. History of hypertension.   PLAN:  1. After informed consent, we injected the left knee via lateral      approach with 40 mg Kenalog and 3 mL 1% lidocaine.  The patient      tolerated it well.  2. Advised the patient to follow up with her family physician      regarding anxiety medication.  I really do not feel comfortable      prescribing it for her if I will not be following her on a more      regular basis.  3. Followup for tremor.  Judging by her presentation today, have some      concerns over developing Parkinson-type features.  4. I will see her  back on a p.r.n. basis.  Could consider Synvisc if      she does not respond to steroid injection today.      Ranelle Oyster, M.D.  Electronically Signed     ZTS/MedQ  D:  09/19/2008 14:23:25  T:  09/20/2008 05:20:19  Job #:  [id number]

## 2011-03-19 NOTE — H&P (Signed)
[name], [name]             ACCOUNT NO.:  [id number]   MEDICAL RECORD NO.:  [id number]          PATIENT TYPE:  INP   LOCATION:  6713                         FACILITY:  [facility]   PHYSICIAN:  Felipa Evener, MD  DATE OF BIRTH:  1922-06-01   DATE OF ADMISSION:  10/12/2008  DATE OF DISCHARGE:                              HISTORY & PHYSICAL   IDENTIFICATION DATA:  The patient is an 77 year old female with past  medical history significant for some hypertension otherwise, past  medical history is unknown, who is seen by Dr. Alroy Dust, [facility].  Apparently, the patient was recently treated in  November for UTI with ciprofloxacin and was complaining of nausea and  vomiting since yesterday night, reported to the ER earlier today and was  discharged after treatment of her nausea and vomiting and the urinary  tract infection was ruled out only to return today at 11:00 p.m. with  similar symptoms and Pulmonary Clinical Care was called to admit.  The  patient is currently confused.  There is a questionable history of  baseline dementia and was unable to provide further history.  No family  is present at the bedside.   PAST MEDICAL HISTORY:  Unknown.   MEDICATIONS:  Unknown.   SOCIAL HISTORY:  Unknown.   FAMILY HISTORY:  Unknown.   A 12-point review of systems was attempted and was negative for any  fever or chills, but the patient did complain of chest pain since  yesterday.  Otherwise, the 12-point review of systems is negative.   PHYSICAL EXAMINATION:  GENERAL:  This is an ill-appearing elderly  female, resting comfortably on the exam bed, in no acute distress.  VITAL SIGNS:  She is afebrile.  Her heart rate of 120, respiratory rate  of 18, blood pressure of 134/68, and saturation of 96% on room air.  HEENT:  Normocephalic, atraumatic.  Pupils were equal, round, and  reactive to light.  Extraocular movements intact.  Oronasal mucosa  within normal limits.  NECK:  There is no thyromegaly, lymphadenopathy or jugular venous reflux  appreciated.  HEART:  Regular rate and rhythm.  S1 and S2.  No murmurs, rubs, or  gallops appreciated.  LUNGS:  Clear to auscultation bilaterally.  ABDOMEN:  Soft, nontender, and nondistended.  Positive bowel sounds.  EXTREMITIES:  No edema and no tenderness appreciated.  NEUROLOGIC:  Grossly intact except for confusion.   LABORATORY STUDIES:  They were all reviewed.  Significant for an  abdominal x-ray that showed a borderline cardiac size with no pulmonary  edema or pleural effusion appreciated.  Mild fecal distention in the  colon with calcified uterine leiomyomas present and no pneumoperitoneum.  Her white blood cell count is 8.3, hemoglobin is 12.1, hematocrit is  35.6, and platelet count of 190.  Her BMP with sodium of 132, potassium  of 3.4, chloride of 96, CO2 of 26, glucose of 89, BUN is 8, creatinine  is 0.52, and calcium of 8.7.  Earlier today at 10:30 a.m., the patient  had a urinalysis that showed many bacteria in urine with 3-6 white blood  cells.  Urinalysis was negative.  Initial troponin was negative.  Her  lipase was 22 and her CK-MB was 3.6.  LFTs were negative.   ASSESSMENT AND PLAN:  The patient is an 77 year old female with past  medical history significant for hypertension, presented with nausea and  vomiting with a recent treatment for urinary tract infection, 3-6 white  blood cells in the urine report.  At this time, we will admit to the  telemetry unit to rule out an myocardial infarction.  We will start the  patient on Rocephin 1 g IV daily x5 days.  I will start metoprolol as  she is tachycardic and hypertensive and I am unsure of what her usual  medication is, with beta-blocker withdrawal being a possibility Zofran 4  mg daily will be given.  Milk of magnesia 5 mL q.12 on a p.r.n. basis as  well as Colace 100 mg p.o. b.i.d. will be given and we will check her  coags, hemoglobin A1c.   Start her on normal saline at 75 mL per hour.  We give the patient KCl 40 mEq p.o. q.6 h. x2 doses with a.m. check for  potassium level.      Felipa Evener, MD  Electronically Signed     WJY/MEDQ  D:  10/12/2008  T:  10/13/2008  Job:  [id number]

## 2011-03-19 NOTE — Assessment & Plan Note (Signed)
[facility]                         GASTROENTEROLOGY OFFICE NOTE   Norma, [name]                      MRN:          [id number]  DATE:12/08/2007                            DOB:          10-23-1922    [name] is a very pleasant 77 year old black female who has had  adult onset diabetes for the last 40 years, and is currently insulin  dependent.  She additionally has new-onset Parkinson's disease followed  by Dr. Orlin Hilding and is on carbidopa 50/20 half a tablet twice a day.  She  is referred to me today because of intermittent nausea and vomiting with  anorexia and weight loss, 60 pounds over the last several years.   I have known [name] for many years, and previously did an  endoscopy on her some 10 years ago along with flexible sigmoidoscopy,  both of which were unremarkable.  She has most of that time been  followed by Dr. Kriste Basque for primary care and Dr. Lucianne Muss for her insulin  dependent diabetes.  She lives by herself and fixes all of her meals,  and obviously does not eat a large amount of food.  The patient has been  in the emergency room a couple times in the last month with nausea and  vomiting, with blood sugars as low as 48 mg percent, and there has been  difficulty controlling her insulin dosage.  As part of her workup, she  has had negative laboratory data, ultrasound, and normal technetium  gastric emptying scan with only 30% retention at 2 hours.  She  apparently was placed on Prevacid, but her niece related that she never  obtained these medications.  She had a normal CBC and metabolic profile  with a serum albumin at 3.6 g percent.   The patient has been on intermittent Zofran 4 mg but has not had to use  this in several weeks.  Better dietary control.  She also suffers from  chronic hypertension and chronic anxiety syndrome.  She has had some  peripheral vascular disease associated with her diabetes, chronic venous  insufficiency.  She had to have local wound care to her lower  extremities.  She also has problems including recurrent urinary tract  infection, degenerative joint disease, and low back pain syndrome.   The patient denies any specific food intolerances, or any symptoms of  steatorrhea.  In fact, she has chronic constipation and will  occasionally have to take a laxative.  She denies melena or  hematochezia.  She denies abdominal pain, fever, chills, or any systemic  complaints.   MEDICATIONS:  1. Aspirin 81 mg a day.  2. A variety of eye drops.  3. Humalog insulin 70/25 24 units in the morning and 22 units at      night.  4. Carbidopa 50/20 half a tablet twice a day.  5. Xanax 0.25 mg 3 times a day.  6. P.r.n. Zofran.  7. P.r.n. methocarbamol 500 mg.   There is no history of known drug allergies.   FAMILY HISTORY:  Remarkable for diabetes in her mother.  Several members  with atherosclerosis.  There are no known gastrointestinal problems.   SOCIAL HISTORY:  The patient lives by herself and fixes her own meals.  She has a 5th grade education.  She does not smoke or use ethanol.   REVIEW OF SYSTEMS:  GENITOURINARY:  The patient is chronically  incontinent of urine and wears Depends.  VASCULAR:  She has peripheral  vascular disease of the lower extremities.  She, however, besides the  Parkinson's disease, has no NEUROPSYCHIATRIC problems and has a good and  intact memory.  CARDIOVASCULAR/PULMONARY:  She denies current  cardiovascular or pulmonary complaints.  Her review of systems is  otherwise noncontributory.   PHYSICAL EXAMINATION:  GENERAL APPEARANCE:  She is an elderly appearing  black female appearing older than her stated age with a chronic resting  tremor.  VITAL SIGNS:  She is 5 feet tall and weighs 136 pounds.  Blood pressure  119/44 and pulse was 80 and regular.  CHEST:  Generally clear.  She appeared to be in a regular rhythm without  murmurs, rubs or gallops.   ABDOMEN:  I could not appreciate hepatosplenomegaly, abdominal masses or  tenderness.  Bowel sounds were normal.  I could not appreciate a  succussion splash.  RECTAL:  Rectal exam showed hard balls of stool in the rectum without  definite impaction.  The stool was guaiac negative.  PSYCHIATRIC:  Mental status was intact.   ASSESSMENT:  1. Anorexia and weight loss all related to her social situation and      her poorly controlled diabetes.  2. Probable element of gastroesophageal reflux disease.  3. Chronic functional constipation, perhaps contributing to some of      her gastrointestinal symptomatology.  4. Insulin-dependent diabetes mellitus with possible low grade gut      enteropathy, although this has not been confirmed by emptying scan.  5. Parkinson's disease on Carbidopa therapy.  6. Intermittent nausea and vomiting directly related to hypoglycemia      related to poor food intake and insulin dosages.  7. Hypertensive cardiovascular disease.  8. Degenerative arthritis and chronic back pain.  9. Chronic urinary incontinence with recurrent urinary tract      infections.  10.History of previous lacunar infarcts on MRI of the head.  11.History of peripheral vascular disease and diabetic ischemia to her      feet.   RECOMMENDATIONS:  1. I have discussed with her niece about the patient perhaps being      placed in long-term care but apparently the patient has refused      such and also refuses Meals on Wheels.  2. Empirically placed on Prevacid 30 mg q.a.m., 30 minutes before      breakfast.  3. MiraLax in 8 ounces on a regular basis at bedtime.  4. Repeat laboratory tests including thyroid functions and anemia      profile.  5. Ongoing dietary and insulin control of her diabetes per Dr. Kriste Basque      and Dr. Lucianne Muss.   ADDENDUM:  I did check a celiac panel because the patient is diabetic,  although I think it is unlikely that she has celiac disease.  I really  see no need to go  into a long and involved gastrointestinal endoscopic  evaluation or malabsorption evaluation since I think a large part of her  problems are social situation-related.  The niece asked about Ensure and  I see no problems with her using Ensure twice a day as a dietary  supplement.  Again, I think  long-term nursing home care would be best  for this patient to better control her nutritional management and  insulin therapy.     Vania Rea. Jarold Motto, MD, Caleen Essex, FAGA  Electronically Signed    DRP/MedQ  DD: 12/08/2007  DT: 12/09/2007  Job #: [id number]   cc:   Lonzo Cloud. Kriste Basque, MD  Reather Littler, M.D.

## 2011-03-19 NOTE — Assessment & Plan Note (Signed)
[facility]   NAME:  SHAENA, PARKERSON             ACCOUNT NO.:  [id number]   MEDICAL RECORD NO.:  [id number]      DATE OF BIRTH:  1921-11-22   PHYSICIAN:  Theresia Majors. Tanda Rockers, M.D. VISIT DATE:  06/30/2007                                   OFFICE VISIT   HISTORY OF PRESENT ILLNESS:  Ms. Chesney is an 77 year old lady who has  undergoing hyperbaric oxygen treatment for a Wagner grade 3 diabetic  foot ulcer involving the left foot area.  In the interim, there has been  no excessive drainage, malodor, pain or fever.   OBJECTIVE:  VITAL SIGNS:  Her blood pressure is 157/68, pulse rate 68,  respirations 16, temperature 97.6.  Capillary blood glucose is 159 mg%  following HBO.  EXTREMITIES:  Inspection of the wound shows that there is 100%  granulation.  No debridement is needed.  There is no evidence of  ischemic compromise.  There is no evidence of ascending infection.   IMPRESSION:  Clinical improvement with hyperbaric oxygen.   PLAN:  We will continue hyperbaric oxygen and reevaluate the patient in  5 days.      Harold A. Tanda Rockers, M.D.  Electronically Signed     HAN/MEDQ  D:  06/30/2007  T:  06/30/2007  Job:  [id number]

## 2011-03-19 NOTE — Procedures (Signed)
[name], [name]             ACCOUNT NO.:  [id number]   MEDICAL RECORD NO.:  [id number]          PATIENT TYPE:  REC   LOCATION:  [facility]                          FACILITY:  [facility]   PHYSICIAN:  Ranelle Oyster, M.D.DATE OF BIRTH:  09/02/1922   DATE OF PROCEDURE:  12/30/2007  DATE OF DISCHARGE:                               OPERATIVE REPORT   PROCEDURE PERFORMED:  Synvisc injection, left knee, osteoarthritis,  715.96.   After consent, preparation of skin with Betadine, we injected the left  knee via the lateral approach with 5 mL of aqueous Synvisc solution.  The patient tolerated the procedure well without any issue.  This is the  second of three injections.  She has had some results already with the  first injection.  I will see her in about 7 to 10 days for the last  Synvisc injection.      Ranelle Oyster, M.D.  Electronically Signed     ZTS/MEDQ  D:  12/30/2007 04:54:09  T:  12/31/2007 81:19:14  Job:  [id number]

## 2011-03-19 NOTE — Assessment & Plan Note (Signed)
[facility]   NAME:  [name], [name]             ACCOUNT NO.:  [id number]   MEDICAL RECORD NO.:  [id number]      DATE OF BIRTH:  [birth year]   PHYSICIAN:  Maxwell Caul, M.D. VISIT DATE:  05/11/2007                                   OFFICE VISIT   [name] is a lady who is being treated with hyperbaric oxygen for  a Wegener's grade 3 diabetic foot ulcer involving the left hallux.  There has been improvement with hyperbaric oxygen.  The plan, in  addition hyperbaric oxygen, had been to proceed with an Apligraf  placement.   EXAMINATION:  The wound was properly prepared.  An Apligraf was applied  in the standard fashion.  This was covered with Mepitel.  We placed  gauze on top of this and wrapped with an Ace wrap in order to ensure  proper contact of the Apligraf with the wound bed itself.  There was no  evidence of infection.           ______________________________  Maxwell Caul, M.D.     MGR/MEDQ  D:  05/12/2007  T:  05/12/2007  Job:  [id number]

## 2011-03-19 NOTE — Assessment & Plan Note (Signed)
[facility]   NAME:  Norma, [name]             ACCOUNT NO.:  [id number]   MEDICAL RECORD NO.:  [id number]      DATE OF BIRTH:  July 13, 1922   PHYSICIAN:  Theresia Majors. Tanda Rockers, M.D. VISIT DATE:  05/05/2007                                   OFFICE VISIT   SUBJECTIVE:  [name] is an 77 year old female whom we are treating  with hyperbaric oxygen for Wagner grade 3 diabetic foot ulcer involving  the left hallux.  She has completed a total of 10 dives as of today.  She is for wound evaluation.  The patient has improved since initiating  HBO.  She is having less pain.  There is less drainage.  She is  increasing her activity and is able to walk behind her wheelchair  without difficulty.  She is moving more independently within her house.  There has been no interim fever or malodor.  She continues to monitor  her diabetes.   OBJECTIVE:  Blood pressure is 150/67, respirations are 18, pulse rate  87, temperature is 97.5.  Inspection of the wound shows that there is healthy-appearing  granulation with a moderately thick, yellowish exudate.  Under a topical  anesthetic mixture the wound underwent a sharp debridement with the  wound curette, with hemorrhage controlled with direct pressure.   ASSESSMENT:  Clinical improvement with hyperbaric oxygen therapy.   PLAN:  We will resume most-moist dressing and we will plan to proceed  with an Apligraf placement now that we have been adequately prepared  wound bed.  We have discussed this option with the patient.  She seems  to understand and wishes to proceed.  She expresses gratitude for having  been seen in the clinic.  We will continue her HBO therapy.      Harold A. Tanda Rockers, M.D.  Electronically Signed     HAN/MEDQ  D:  05/05/2007  T:  05/05/2007  Job:  [id number]

## 2011-03-19 NOTE — Assessment & Plan Note (Signed)
[facility]   NAMEZARRA, [name]             ACCOUNT NO.:  [id number]   MEDICAL RECORD NO.:  [id number]      DATE OF BIRTH:  [birth year]   PHYSICIAN:  Theresia Majors. Tanda Rockers, M.D. VISIT DATE:  08/18/2007                                   OFFICE VISIT   SUBJECTIVE:  [name] returns for follow-up of a [name] grade 3  diabetic foot ulcer involving the left hallux.  In the interim, we have  treated her with antiseptic soap washes.  She continues to be  ambulatory.  There is been no excessive drainage, malodor, pain, or  fever.   OBJECTIVE:  VITAL SIGNS:  Blood pressure is 171/76, respirations 16,  pulse rate 77, temperature 98,1.  Capillary blood glucose is 131 mg %.  EXTREMITIES:  Inspection of the left foot shows that there is been  dramatic decrease in the wound.  It is 99% re-epithelialized.  There is  no exposed tendon or bone.  There is no evidence of infection.  Capillary refill is moderate and consistent with a known level of  vascular insufficiency.   ASSESSMENT:  Clinically improved wound, Wagner grade 3 diabetic foot  ulcer.   PLAN:  We will continue the local care with antiseptic soap wash and an  offloading protective healing sandal.  We will reevaluate the patient in  1 month p.r.n.      Harold A. Tanda Rockers, M.D.  Electronically Signed     HAN/MEDQ  D:  08/18/2007  T:  08/19/2007  Job:  [id number]

## 2011-03-19 NOTE — Consult Note (Signed)
NAMELAQUITA, HARLAN             ACCOUNT NO.:  [id number]   MEDICAL RECORD NO.:  [id number]          PATIENT TYPE:  INP   LOCATION:  6713                         FACILITY:  [facility]   PHYSICIAN:  Antonietta Breach, M.D.  DATE OF BIRTH:  29-Apr-1922   DATE OF CONSULTATION:  10/18/2008  DATE OF DISCHARGE:                                 CONSULTATION   REASON FOR CONSULTATION:  Mental status changes.  Evaluate, make  recommendations, and assess capacity.   HISTORY OF PRESENT ILLNESS:  Ms. Keishawn Darsey is an 77 year old  female admitted to the [facility] on December 9 of 2009 due to  dehydration, nausea and vomiting.   Ms. Magel does have approximately 6 weeks of progressive decrease in  energy and eventually developed the symptoms described above.  She also  has had some difficulty with short-term recall as well as an impairment  in her appreciation of her general medical problems and the potential  risk of morbidity and mortality.   In the outpatient setting, she has refused certain changes and her  insulin regimen.  Also she has refuse parkinsonian medicine and there  has been no rational process through which these medicines are rejected.   She is not agitated or combative.  She does describe hope and her normal  interests.  As mentioned she does have decreased energy.  She does have  some decreased concentration.   Her current general medical treatment has not been associated with an  improvement in the symptoms above.  Also, she appears to have had some  associated factors of general medical illness regarding her progression  of decreased energy and decreased concentration.   PAST PSYCHIATRIC HISTORY:  The patient does have a pattern of  irrationally rejecting medications that would be helpful for her.   She has had some memory decline over several weeks.    In review of the past medical record, the patient does not have any  listing of past difficulties with  memory and cognition.  She was noted  to have some anxiety during her November 16 visit in 2009 in outpatient  setting.  She had decreased appetite at that time and the patient had  insight into her anxiety symptoms.   She has not received receive psychotropic medication treatment.   FAMILY PSYCHIATRIC HISTORY:  None known.   SOCIAL HISTORY:  Ms. Fohl is widowed.  She is retired from a [employer].  She has a daughter who has been helping her, but her daughter has  found it impossible to care for the patient now due to her  deterioration.  The patient also has a grandson in [place].  Mrs.  Monday does not use any illegal drugs or alcohol.  She is Location manager.   PAST MEDICAL HISTORY:  Hypertension, diabetes, history of Parkinson's  symptoms.   MEDICATIONS:  MAR is reviewed.  Ms. Trimble is not on any current  psychotropic medication.   She has no known drug allergies.   LABORATORY DATA:  The sodium is 138, BUN is 43, creatinine 1.2, glucose  149, WBC is 7.2, hemoglobin  10.9, platelet count 196,000.  Hemoglobin  A1c elevated 6.9 reflecting a lack of compliance.  Phosphorus is normal.  Magnesium normal.   SGOT on December 9 was normal at 32, SGPT normal at 17.   REVIEW OF SYSTEMS:  This was partially provided by the patient as well  as the past medical record.  Constitutional, head, eyes, ears, nose,  throat, mouth, neurologic, cardiovascular, respiratory,  gastrointestinal, genitourinary, skin, musculoskeletal, hematologic,  lymphatic, endocrine, and metabolic are all unremarkable.   EXAMINATION:  VITAL SIGNS:  Temperature 97.8, pulse 83, respiratory rate  20, blood pressure 106/51, O2 saturation on room air 100%.  GENERAL APPEARANCE:  Ms. Chestang is an elderly female sitting up in a  partially reclined position in her hospital chair, leaning off to the  side, clearly fatigued, but there are no abnormal involuntary movements.   MENTAL STATUS EXAM:  Eye contact  is mostly intact.  Her attention span  is mildly decreased.  Concentration is mildly decreased.  Her affect is  mildly anxious.  Mood is within normal limits.  She he is oriented to  the year, the month, not the day of the month.  She is oriented to the  day of the week and place.   Memory 3/3 immediate, 0/3 on recall.  Speech involves normal rate.  There is slightly flat prosody.  There is no dysarthria.   Thought process is coherent, goal-directed.  No looseness of  associations.  Thought content, no thoughts of harming herself or others  no delusions, no hallucinations.  Insight is poor.  Judgment is poor.   The patient lacks appreciation for her risk of morbidity and mortality  with her general medical problems and she does demonstrate this with her  history of noncompliance with recommended changes in her medication, and  is also reflected in her elevated hemoglobin A1c.   ASSESSMENT:  AXIS I:  1. 294.9 unspecified persistent mental disorder NOS.  2. 293.84 anxiety disorder not otherwise specified.  3. Dementia not otherwise specified.  AXIS II:  Deferred.  AXIS III:  See past medical history.  AXIS IV:  General medical, primary support group.  AXIS V:  40.   Mrs. Plott demonstrates critical impairment in judgment and  reasoning.  She lacks appreciation for her general medical problems and  the potential risk of morbidity and mortality.  She also has significant  memory impairment.   Mrs. Derringer does lack the capacity for informed consent.   RECOMMENDATIONS:  1. Would confirm that a reversible memory dysfunction and cognitive      dysfunction etiology workup has been completed including B12, folic      acid, RPR, TSH and consider cranial hemorrhage.  2. If she does continue with these memory deficits, even if mild, and      her workup is negative, would start Aricept 5 mg nightly with      caution about loose stools or other cholinergic gastrointestinal      side  effects.  3. Preliminary discharge planning if outpatient psychiatric followup      is needed, there are clinics available at [facility],      [facility] or [facility].   DISCUSSION:  Mrs. Wickard's incapacitating diagnosis is dementia not  otherwise specified.      Antonietta Breach, M.D.  Electronically Signed     JW/MEDQ  D:  10/18/2008  T:  10/18/2008  Job:  [id number]

## 2011-03-19 NOTE — Assessment & Plan Note (Signed)
[name] is back regarding her knee pain.  She has done fairly well since  the Synvisc injections last March.  She states that she has no pain at  rest.  She still has some pain in her knees when she walks for longer  distances.  She does do some walking around the home, but does not go  long distances.  She has an aid that helps her in the morning a bit now  with her morning chores.  Otherwise she is independent.  She is sleeping  well.  She has lost weight but eating fairly well.  She has some  swelling in both legs, particularly on the left side that bothers her a  bit.  She wears stockings but does not wear frank compression hose.   REVIEW OF SYSTEMS:  Notable for some tremor.  Notes trouble walking.  Occasional low blood sugar and poor appetite at times.  Full review is  in the written health and history section of the chart.   SOCIAL HISTORY:  As noted above.   PHYSICAL EXAMINATION:  VITAL SIGNS:  Blood pressure 154/54, pulse 86,  respiratory rate is 18, and she is saturating 96% on room air.  GENERAL:  The patient is pleasant, alert and oriented x3.  Both knees  are somewhat tender to resisted extension and flexion, but she has good  range of motion with minimal crepitus appreciated today.  No joint  effusion is noted.  She has some swelling in the distal lower  extremities.  She is wearing her stockings today which are acting as  somewhat of a tourniquet, as they are rolled up in the popliteal region.  Cognition is fair.  She has a mild resting tremor in her right hand  today.  She walks with a slow deliberate gait.  She is stable, however,  and is able to adjust for the terrain and obstacles in front of her.   ASSESSMENT:  1. Bilateral osteoarthritis of the knees.  2. History of hypertension.   PLAN:  1. Continue exercise and range of motion exercises as she is doing.  2. We discussed other means to help control edema.  She will need to      make sure her stockings are not  rolled up on the leg.  3. I have nothing further to offer her today.  She is doing fairly      well overall.  I will see her back on an as-needed basis in the      future.      Ranelle Oyster, M.D.  Electronically Signed     ZTS/MedQ  D:  04/04/2008 11:54:44  T:  04/04/2008 12:35:50  Job #:  [id number]

## 2011-03-19 NOTE — Assessment & Plan Note (Signed)
[facility]   NAME:  [name], [name]             ACCOUNT NO.:  [id number]   MEDICAL RECORD NO.:  [id number]      DATE OF BIRTH:  November 28, 1921   PHYSICIAN:  Theresia Majors. Tanda Rockers, M.D. VISIT DATE:  04/28/2007                                   OFFICE VISIT   SUBJECTIVE:  [name] is an 77 year old lady who was concurrently  undergoing hyperbaric oxygen treatment for Wagner grade III diabetic  foot ulcer.  She has currently completed 6 of a total order treatments  of 30.  Over the last 24 hours she has complained of some runny nose and  general myalgias.  She has had no fever.  She presents for wound  assessment during HBO there has been no excessive drainage malodor pain  or fever.  She has decreased her activity due to the decrease in pain  concurrent with HBO treatment.   OBJECTIVE:  Her blood pressure is 161/60, pulse rate 78, respirations  18, temperature 97.5.  Capillary blood glucose 202 mg percent post HBO  treatment.  The HEENT exam is remarkable for mild nasal hyperemia, but there is no  unusual edema of the turbinates.  The oropharynx is clear.  The tympanic  membranes are pearly and non inflamed.  There is no evidence of  sinusitis or obstructive pathology.  The neck is supple.  The lungs are clear.  Heart sounds were normal.  Inspection of the left hallux shows that areas of nonviable tissue where  easily debrided with the wound curette with hemorrhage controlled with  direct pressure.  This debridement was achieved in the EMLA block.  There is normal capillary refill and no evidence of the ascending  infection.   ASSESSMENT:  Clinical improvement associated with hyperbaric oxygen  treatment.   PLAN:  We will continue HBO therapy and in the interim we will address  her with a moist-moist dressing and continue offloading with the healing  sandal.      Jake Shark A. Tanda Rockers, M.D.  Electronically Signed     HAN/MEDQ  D:  04/28/2007  T:   04/28/2007  Job:  [id number]

## 2011-03-19 NOTE — Procedures (Signed)
[name], [name]             ACCOUNT NO.:  [id number]   MEDICAL RECORD NO.:  [id number]          PATIENT TYPE:  REC   LOCATION:  [facility]                          FACILITY:  [facility]   PHYSICIAN:  Ranelle Oyster, M.D.DATE OF BIRTH:  10/01/1922   DATE OF PROCEDURE:  01/11/2008  DATE OF DISCHARGE:                               OPERATIVE REPORT   PROCEDURE PERFORMED:  Synvisc injection, left knee osteoarthritis,  715.96.   After informed consent, we prepared the skin with Betadine.  Injected  the left knee via lateral approach with 5 mL of aqueous Synvisc  solution.  The patient tolerated it without any problems.  This is the  third of three injections.  She has had good results thus far.  I will  have her come back to the office in 3 months for followup.  Recommended  edema control, weightbearing exercise as tolerated, and appropriate diet  and weight loss.      Ranelle Oyster, M.D.  Electronically Signed     ZTS/MEDQ  D:  01/11/2008 15:38:14  T:  01/12/2008 14:19:02  Job:  [id number]

## 2011-03-19 NOTE — Assessment & Plan Note (Signed)
[facility]   [name], [name]             ACCOUNT NO.:  [id number]   MEDICAL RECORD NO.:  [id number]      DATE OF BIRTH:  July 06, 1922   PHYSICIAN:  Theresia Majors. Tanda Rockers, M.D. VISIT DATE:  05/19/2007                                   OFFICE VISIT   SUBJECTIVE:  [name] is an 77 year old lady whom we are treating  for Wagoner grade 3 diabetic foot ulcer utilizing hyperbaric oxygen. One  week ago she underwent an Apligraf placement to the wound.  She returns  for followup.  She has completed 18 of 38 HBO treatments.   OBJECTIVE:  Her blood pressure is 149/55, respirations 16, pulse rate  73.  Capillary blood glucose post HBO was 282 mg percent.  Inspection of  the left foot shows there is trace edema.  The Apligraf is well in place  with moderate drainage but no excessive malodor.  There is no evidence  of ascending cellulitis or concurrent infection or ischemia.   ASSESSMENT:  Satisfactory appearance following Apligraf.   PLAN:  We will continue HBO and reevaluate the wound in 1 week      Harold A. Tanda Rockers, M.D.  Electronically Signed     HAN/MEDQ  D:  05/19/2007  T:  05/19/2007  Job:  [id number]

## 2011-03-19 NOTE — Assessment & Plan Note (Signed)
[facility]   [name], [name]             ACCOUNT NO.:  [id number]   MEDICAL RECORD NO.:  [id number]      DATE OF BIRTH:  03-Feb-1922   PHYSICIAN:  Theresia Majors. Tanda Rockers, M.D.      VISIT DATE:                                   OFFICE VISIT   SUBJECTIVE:  [name] is an 77 year old lady who is undergoing  hyperbaric oxygen treatment for a Wegner grade III foot ulcer.  She  underwent placement of an Apligraf 2 weeks ago.  She continues to have  an uncomplicated course of HBO being administered daily.  In the interim  she denies excessive drainage, malodor, pain, or fever.  She is  accompanied by her daughter.   OBJECTIVE:  Blood pressure is 157/69.  Pulse rate of 72.  Respirations  16.  Temperature 97.8.  Capillary blood glucose following HBO was 244  mg%.  Inspection of the wound shows that there is 100% granulation with  no exposed bone or cartilage.  There is no evidence of ascended  infection or cellulitis.  There is scant drainage to moisture, and the  periwound area is normal.   ASSESSMENT:  Clinical improvement with Apligraf in conjunction with  continued hyperbaric oxygen treatment.   PLAN:  We will continue her hyperbaric oxygen treatment.  We will add a  Regranex protocol to her wound management.  In the interim we will place  her in a moist/moist dressing.  This will be changed daily at the end of  her HBO treatment.   We have explained this assessment and future plans of management to the  patient and her daughter terms that they seems to understand.  They  expressed gratitude for having seen in the clinic and indicate a  willingness to comply as per above.      Harold A. Tanda Rockers, M.D.  Electronically Signed     HAN/MEDQ  D:  05/26/2007  T:  05/26/2007  Job:  [id number]

## 2011-03-19 NOTE — Assessment & Plan Note (Signed)
[facility]   NAMEMINNAH, LLAMAS             ACCOUNT NO.:  [id number]   MEDICAL RECORD NO.:  [id number]      DATE OF BIRTH:  14-Oct-1922   PHYSICIAN:  Theresia Majors. Tanda Rockers, M.D. VISIT DATE:  06/23/2007                                   OFFICE VISIT   SUBJECTIVE:  Ms. Thaden is an 77 year old who is currently undergoing  HBO treatment for Wagner grade 3 diabetic foot ulcer involving the left  hallux.  In the interim she reports decrease in pain.  No excessive  drainage malodor pain or fever.   OBJECTIVE:  Blood pressure is 170/81, respirations 16, pulse rate 83,  temperature is 97.7.  Capillary blood glucose is 235 mg percent  following her HBO.  Inspection of the wound shows that.  There is 100% granulation.  There  is no evidence of infection, cellulitis, or erythema.  The pedal pulse  remains indeterminate.  Capillary refill is brisk.   ASSESSMENT:  Improved Wagner 3 diabetic foot ulcer.   PLAN:  Continue HBO reevaluate the wound in 1 week.      Harold A. Tanda Rockers, M.D.  Electronically Signed     HAN/MEDQ  D:  06/23/2007  T:  06/23/2007  Job:  [id number]

## 2011-03-19 NOTE — Procedures (Signed)
[name], [name]             ACCOUNT NO.:  [id number]   MEDICAL RECORD NO.:  [id number]          PATIENT TYPE:  REC   LOCATION:  [facility]                          FACILITY:  [facility]   PHYSICIAN:  Ranelle Oyster, M.D.DATE OF BIRTH:  [birth year]   DATE OF PROCEDURE:  12/21/2007  DATE OF DISCHARGE:                               OPERATIVE REPORT   [name] is back regarding her left knee osteoarthritis.  Diagnostic code  715.96.   DESCRIPTION OF PROCEDURE:  After informed consent and preparation of the  skin with Betadine and alcohol, we injected the left medial lateral  approach with 5 mL of aqueous Synvisc solution.  The patient tolerated  well without issues.   Will perform the second of 3 injections in 10 days and the third  injection in 20 days.   The patient has lost noticeable weight and is trying to remain active  which I am very pleased with.      Ranelle Oyster, M.D.  Electronically Signed     ZTS/MEDQ  D:  12/21/2007 10:43:30  T:  12/22/2007 09:54:06  Job:  [id number]

## 2011-03-19 NOTE — Discharge Summary (Signed)
[name], [name]             ACCOUNT NO.:  [id number]   MEDICAL RECORD NO.:  [id number]          PATIENT TYPE:  INP   LOCATION:  6713                         FACILITY:  [facility]   PHYSICIAN:  Lonzo Cloud. Kriste Basque, MD     DATE OF BIRTH:  [birth year]   DATE OF ADMISSION:  10/12/2008  DATE OF DISCHARGE:  10/18/2008                               DISCHARGE SUMMARY   FINAL DIAGNOSES:  1. Admitted October 12, 2008, with urinary tract infection, nausea,      vomiting, and failure to thrive at home.  Urine culture revealed      multiple species and she responded to IV Rocephin, switched to oral      Cipro.  2. Hypertension - medications adjusted this admission.  3. Insulin-dependent diabetes mellitus - insulin doses adjusted this      admission.  4. History of obesity - the patient's weight has come down      significantly over the last year.  5. History of venous insufficiency and diabetic foot ulcer - nothing      active at the present time.  6. History of gastroesophageal reflux disease.  7. History of irritable bowel syndrome.  8. Degenerative arthritis.  9. History of low back pain syndrome.  10.History of vitamin D deficiency.  11.History of severe Parkinson disease - she has marked manifestations      of significant Parkinson syndrome.  She was previously on Sinemet.      She stopped this medicine on her own and refuses to restart or      follow up with Neurology.  12.Known cerebrovascular disease with old lacunar infarct on the right      and small-vessel ischemic changes.  13.History of anxiety - pending evaluation by Dr. Jeanie Sewer for      psychiatry.   BRIEF HISTORY AND PHYSICAL:  The patient is an 77 year old black female  who was brought to emergency room with failure to thrive at home, some  nausea, vomiting, and found to have urinary tract infection.  She was  admitted for further evaluation and treatment.  The patient and her  daughter have been to [facility] regarding her  long-term care situation.  The patient has been living on her own caring for herself, but doing  poorly.  Daughter has been unable to coax her into an assisted living  situation.  They remained at a significant impasse regarding her future  care.   PAST MEDICAL HISTORY:  As noted, the patient has a history of  hypertension, insulin-dependent diabetes mellitus, morbid obesity with  significant weight loss over the last year or so.  She has  cerebrovascular disease with old lacunar infarct on the right and small-  vessel disease per MRI 10 years ago.  She has severe Parkinson disease,  previously on Sinemet under care of Dr. Orlin Hilding, but the patient has  refused to take this medicine and refused to believe that she has  Parkinson disease.  Her other medical problems include venous  insufficiency, gastroesophageal reflux disease, irritable bowel  syndrome, degenerative arthritis, low back pain, and vitamin D  deficiency.  Physical examination at the time of admission revealed an ill-appearing  elderly woman in no acute distress.  Blood pressure 130/60, pulse 110  and regular, respirations 18 per minute not labored, temperature 98  degrees, O2 sat 96% on room air.  HEENT exam unremarkable except for dry  mucous membranes.  Neck exam showed no thyromegaly, no jugular venous  distention, no carotid bruits.  Heart revealed regular rate and rhythm,  normal S1 and S2, no murmurs, rubs, or gallops heard.  Lungs were clear  to percussion and auscultation.  Abdomen was soft and nontender without  evidence organomegaly or masses.  Extremities showed some venous  insufficiency, trace edema, degenerative arthritis, no cyanosis or  clubbing.  Neurologic exam was grossly intact without any focal  neurologic abnormalities detected.   LABORATORY DATA:  EKG showed normal sinus rhythm and appeared to be  within normal limits.  Chest x-ray showed clear lungs, some decreased  aeration at the bases, some  interstitial prominence, no active disease.  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  No acute abnormalities.  CBC showed  hemoglobin 12.8, hematocrit 38.0, white count 7900 with a normal  differential.  Sodium 138, potassium 4.4, chloride 100, CO2 of 28, BUN  13, creatinine 0.65, blood sugar 126.  Total bilirubin 0.9, alk phos 76,  SGOT 32, SGPT 17, total protein 7.2, albumin 3.6, calcium 9.2.  CPK was  101 with negative MB and negative troponin.  Serum lipase normal at 22.  Urinalysis showed white cells and bacteria, culture grew multiple  species.  Serial labs were followed throughout her hospital course and  the results are available on hospital cumulative laboratory summary  sheet.  I should note that her hemoglobin A1c was 6.9, serum magnesium  1.7, phosphorus 3.1.   HOSPITAL COURSE:  The patient was admitted with urinary tract infection  and failure to thrive at home.  She was given IV Rocephin and later  switched to p.o. Cipro with resolution of urinary tract symptoms.  She  rapidly returned to her baseline status.  She was seen by [employer] and discharge planning.  There was quite a bit of difficulty  between the patient and the daughter regarding her post hospital plans,  and this is still not decided at the time of this dictation.  The  patient's diabetes was monitored throughout her hospital course and her  NovoLog mix 75/25 insulin was adjusted.  She was given MiraLax and  Senokot for constipation.  Her blood pressure meds were adjusted and she  improved with metoprolol 12.5 mg p.o. b.i.d. and Diovan 40 mg p.o.  daily.  It was hoped that she would agree with short-term nursing home  placement for rehab purposes.  This is still trying to be worked out and  Dr. Jeanie Sewer will be seeing the patient to determine her competence.   MEDICATIONS AT DISCHARGE:  1. NovoLog mix insulin 75/25, 10 units before  breakfast and 10 units      before dinner with careful monitoring of blood sugars at home.  2. Enteric-coated aspirin 81 mg p.o. daily.  3. Metoprolol tartrate 25 mg one-half tablet p.o. b.i.d.  4. Diovan 80 mg tablets one-half tablet p.o. daily.  5. Alprazolam 0.25 mg p.o. t.i.d. as needed for nerves.  6. MiraLax 1 capful in water daily.  7. Senokot-S 1-2 tablets p.o. at bedtime.   CONDITION AT DISCHARGE:  Improved.   DISPOSITION:  This is  still trying to be worked out at the time this  dictation.      Lonzo Cloud. Kriste Basque, MD  Electronically Signed     SMN/MEDQ  D:  10/18/2008  T:  10/18/2008  Job:  [id number]

## 2011-03-19 NOTE — Assessment & Plan Note (Signed)
[facility]   NAME:  [name], [name]             ACCOUNT NO.:  [id number]   MEDICAL RECORD NO.:  [id number]      DATE OF BIRTH:  [birth year]   PHYSICIAN:  Theresia Majors. Tanda Rockers, M.D. VISIT DATE:  07/08/2007                                   OFFICE VISIT   SUBJECTIVE:  [name] is an 77 year old lady whom we are treating  for Wagoner grade 3 diabetic foot ulcer involving the left foot.  She  has been tolerating her HBO therapy without symptoms of barotrauma,  claustrophobia or oxygen toxicity.   OBJECTIVE:  VITAL SIGNS:  Her vital signs are listed on the dive  record.Blood pressure is 171/67, respirations 16, pulse rate 69,  temperature 97.8.  Capillary blood glucose post HBO is 233 mg percent.  EXTREMITIES:  Inspection of the left foot shows that the wound on the  lateral portion of the foot is completely granulated. There is no  exposed bone.  There is no evidence of infection.  There is clearly  advancing epithelium from the circumference.   ASSESSMENT:  Improvement with advanced therapies including hyperbaric  oxygen and Apligraf.   PLAN:  We will continue her HBO therapy and reevaluate the wound in 5  days.      Harold A. Tanda Rockers, M.D.  Electronically Signed     HAN/MEDQ  D:  07/08/2007  T:  07/08/2007  Job:  [id number]

## 2011-03-19 NOTE — Assessment & Plan Note (Signed)
[facility]   NAME:  [name], [name]             ACCOUNT NO.:  [id number]   MEDICAL RECORD NO.:  [id number]      DATE OF BIRTH:  12-06-1921   PHYSICIAN:  Theresia Majors. Tanda Rockers, M.D. VISIT DATE:  08/04/2007                                   OFFICE VISIT   SUBJECTIVE:  [name] is an 77 year old lady whom we have followed  for Wagner grade 3 diabetic foot ulcer involving the right hallux.  She  has completed a course of hyperbaric oxygen treatment and an Apligraf.  The wound was clean with healthy granulating tissue.  The patient has  been proceeding with antiseptic soap washes daily wearing a white cotton  sock.  There has been no excessive drainage, malodor, pain, or fever.   OBJECTIVE:  Blood pressure is 149/66, respirations 16, pulse rate 91,  temperature 98.1.  Capillary blood glucose is 96 mg percent.  Inspection of the left foot and the hallux shows that the wound  continues to contract.  There is healthy granulation with the  advancement of epithelium from the periphery.  The wound has, in fact,  developed a central bridge as manifestation of active re-  epithelialization.  There is no evidence of infection.  Both feet are  warm although the pulses are not palpable and there is brisk capillary  refill.   ASSESSMENT:  Clinical improvement of Wagner grade 3 diabetic foot ulcer.   PLAN:  We will continue the daily antiseptic soap wash and using a clean  cotton sock as the dressing.  We will reevaluate the patient in 2 weeks      Harold A. Tanda Rockers, M.D.  Electronically Signed     HAN/MEDQ  D:  08/04/2007  T:  08/04/2007  Job:  [id number]

## 2011-03-19 NOTE — Assessment & Plan Note (Signed)
[facility]   NAME:  GERARDINE, PELTZ             ACCOUNT NO.:  [id number]   MEDICAL RECORD NO.:  [id number]      DATE OF BIRTH:  [birth year]   PHYSICIAN:  Theresia Majors. Tanda Rockers, M.D. VISIT DATE:  07/20/2007                                   OFFICE VISIT   SUBJECTIVE:  Ms. Harmon is an 77 year old lady whom we have followed  for several weeks for a Wagner grade 3 diabetic foot ulcer.  We have  treated her with hyperbaric oxygen treatment and Apligraf.  As of today,  she has completed a second course of 30 HBO treatments.  She returns for  evaluation.   OBJECTIVE:  The blood pressure is 174/99, pulse rate of 90.8,  respirations 18, temperature 98.4, capillary blood glucoses 177 mg  percent.  Inspection of the left medial hallux shows that the wound continues to  contract.  There is advancing epithelium.  There is no excessive exudate  or malodor.  There is absolutely no evidence of infection.  The  capillary refill is brisk.  The pedal pulse remains indeterminate but  there is no evidence of ischemia.  Both of the lower extremities are  warm.   ASSESSMENT:  Clinical response to hyperbaric oxygen therapy.   PLAN:  We are discontinuing her HBO.  We will continue her wound care to  include the daily antiseptic soap washes and a moist-moist dressing.  Will have the Home Health nurse visit her every bi-weekly.  We will  follow her in the clinic in 2 weeks.  We have explained this clinical  approach to the patient in terms that she seems to understand.  We have  given her an opportunity to ask questions, along with her daughter.  They both seem to understand.  They express gratitude for having been  seen in the clinic and indicate that they will be compliant.      Harold A. Tanda Rockers, M.D.  Electronically Signed     HAN/MEDQ  D:  07/21/2007  T:  07/21/2007  Job:  [id number]

## 2011-03-19 NOTE — Assessment & Plan Note (Signed)
[facility]   NAMEKALIYAH, GLADMAN             ACCOUNT NO.:  [id number]   MEDICAL RECORD NO.:  [id number]      DATE OF BIRTH:  1922-03-21   PHYSICIAN:  Theresia Majors. Tanda Rockers, M.D. VISIT DATE:  09/15/2007                                   OFFICE VISIT   SUBJECTIVE:  Ms. Schmuck is an 77 year old female who we treated for  Wegener grade 3 diabetic foot ulcer of the left hallux.  In the interim,  she has been treated with daily antiseptic soap, wash and clean white  sock.  There has been continued contraction.  No drainage and no  malodor.  The daughter feels that the wound has resolved.   OBJECTIVE:  Blood pressure is 142/81, respirations 16, pulse rate 100,  temperature 97.6, capillary blood glucoses 150 mg percent.  Inspection  of the wound shows that there is 100% re-epithelialization.  There is no  evidence of active infection or abscess or malodor.  The capillary  refill is moderate.   ASSESSMENT:  Resolved Wagner grade III diabetic foot ulcer.   PLAN:  We are discharging the patient.  We have instructed her to resume  wearing regular shoes and seek her continued medical follow-up with her  primary care physician with Dr. Alroy Dust.  Ms. Lindseth is  discharged.      Harold A. Tanda Rockers, M.D.  Electronically Signed     HAN/MEDQ  D:  09/15/2007  T:  09/15/2007  Job:  [id number]   cc:   Lonzo Cloud. Kriste Basque, MD

## 2011-03-19 NOTE — Procedures (Signed)
DUPLEX DEEP VENOUS EXAM - LOWER EXTREMITY   INDICATION:  Right lower extremity swelling.   HISTORY:  Edema:  Chronic right lower extremity swelling.  Trauma/Surgery:  No.  Pain:  No.  PE:  No.  Previous DVT:  No.  Anticoagulants:  No.  Other:   DUPLEX EXAM:                CFV   SFV   PopV  PTV    GSV                R  L  R  L  R  L  R   L  R  L  Thrombosis    o  o  o     o     o      o  Spontaneous   +  +  +     +     +      +  Phasic        +  +  +     +     +      +  Augmentation  +  +  +     +     +      +  Compressible  +  +  +     +     +      +  Competent     +  +  +     +     +      +   Legend:  + - yes  o - no  p - partial  D - decreased   IMPRESSION:  1. No evidence of deep or superficial venous thrombosis in the right      lower extremity or left common femoral vein.  2. Acoustic shadowing noted due to the calcification of arteries.    _____________________________  V. Charlena Cross, MD   AS/MEDQ  D:  03/26/2010  T:  03/26/2010  Job:  [id number]
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
## 2011-03-22 NOTE — Assessment & Plan Note (Signed)
MEDICAL RECORD NUMBER:  [id number].   [name] is here in followup of her bilateral osteoarthritis of the knees.  Pain has been improved a great deal after the Synvisc injection. She still  has more pain the left knee, particularly when she gets in and out of the  car but is doing better after these injections. Her sugars have been under  control. She has lost weight. She ambulates with her cane. She still reports  that the pain is a sharp, stabbing type of pain when it hits. She is not  driving or going up steps at this point.   REVIEW OF SYSTEMS:  The patient reports nausea, constipation, urine  retention, and high blood pressure.   SOCIAL HISTORY:  The patient is divorced and lives alone.   PHYSICAL EXAMINATION:  Blood pressure is 160/70, pulse 107, respiratory rate  17. She is saturating 91% on room air. The patient has notably lost weight.  She is pleasant, alert and oriented to person, place, and date. Affect is  appropriate. She uses a cane and walks with a flexed posture but is  improved. She has minimal swelling at both knees. She had full range of  motion. Good strength was noted at 5/5 with flexion and extension of the  knees today.   ASSESSMENT:  Bilateral osteoarthritis of the knees. This has improved status  post Synvisc injections.   PLAN:  1.  Continue exercise.  2.  Encouraged continued weight loss.  3.  Recommended trying glucosamine/chondroitin over the counter for her knee      pain and osteoarthritis. This could potentially help her further.  4.  I will see her back in six months' time.      ZTS/MedQ  D:  02/11/2005 12:12:51  T:  02/11/2005 13:31:34  Job #:  [id number]   cc:   Lonzo Cloud. Kriste Basque, M.D. [place]
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
## 2011-03-22 NOTE — H&P (Signed)
[name], [name]                         ACCOUNT NO.:  [id number]   MEDICAL RECORD NO.:  [id number]                   PATIENT TYPE:  INP   LOCATION:  5158                                 FACILITY:  [facility]   PHYSICIAN:  Reather Littler, M.D.                    DATE OF BIRTH:  Mar 27, 1922   DATE OF ADMISSION:  06/09/2002  DATE OF DISCHARGE:  06/11/2002                                HISTORY & PHYSICAL   CHIEF COMPLAINT:  Low blood sugar history.   HISTORY OF PRESENT ILLNESS:  This is a 77 year old African American female  with known diabetes.  She was found unresponsive at home.  Today, she states  that her blood sugars have been fluctuating but she has not had any recent  hypoglycemia, although her old charts relates an episode of severe  hypoglycemia in the year 2000.  She said that for the last three weeks she  has been eating less because of decreased appetite and mild nausea.   Today, apparently, her blood sugars were over 200 in the morning and she  took her Novolog 5 units as usual before breakfast but only ate a boiled  egg.  She said that last time her blood sugar was apparently still high but  she does not remember what she ate.  The patient also takes 14 units of  Lantus.  She has had tendency to hypoglycemia with 70/30 insulin.  This  previously was changed.   Today, the patient was brought to the emergency room by apparently the  rescue squad.  The blood sugar was undetectable and required Dextrose  intravenously which was repeated in the emergency room.  This brought her  blood sugar up to 188.  Subsequently, her blood sugar has been between 112  and 127.  However, the patient was found to be severely hypothermic.   MEDICATIONS:  Actos, insulin as above, a blood pressure medication, and  Lasix p.r.n.   ALLERGIES:  None.   REVIEW OF SYSTEMS:  She has had diabetes for several years.  She has had  hypotension.  She has had edema with a history of irritable bowel  syndrome  and gastroesophageal reflux disease.  She has had lower leg edema.  History  of cataract surgery.  The patient has no history of severe neuropathy.  No  previous history of vomiting nor diarrhea.  She has had mild chronic  constipation, which is not any worse.   PAST MEDICAL HISTORY:  She has had bilateral cataract surgery.  History of  D&C.  She was told to have a stroke about 18 years ago.  She has a history  of osteoarthritis in her knees and elbows.  History of diabetes since about  77 years old.  The patient has had no other problems or coronary disease.   PHYSICAL EXAMINATION:  GENERAL:  The patient is markedly obese.  She is  alert and cooperative.  VITAL SIGNS:  Pulse of 80, blood pressure is 190/85, and temperature is 92.5  on admission.  Oxygen saturation is normal.  SKIN:  She looks somewhat pale.  HEENT:  She has a mild dryness of her mucus membranes.  Otherwise normal.  NECK:  Normal.  HEART:  Heart sounds normal.  CHEST:  Her breathing is slightly slower.  Lungs clear.  ABDOMEN:  No distention.  Bowel sounds present.  She has mild left upper  quadrant tenderness.  No mass palpable.  RECTAL:     Deferred.  EXTREMITIES:  No edema or skin changes.  Pedal pulses decreased.   ASSESSMENT:  The patient appears to have hypoglycemia secondary to poor  intake which may be related to either gastroparesis, gastritis, or  pancreatic disease.   RECOMMENDATIONS:  Recommendations will be to give her IV fluids for  hydration and dextrose replacement.  She will be monitored for recurrent  hypoglycemia.  Because of her underlying symptoms, she will also be started  on Reglan and Protonix.  If necessary, a CT scan of her abdomen.                                               Reather Littler, M.D.    AK/MEDQ  D:  06/09/2002  T:  06/14/2002  Job:  [id number]   cc:   Lonzo Cloud. Kriste Basque, M.D. [facility]
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
## 2011-03-22 NOTE — Procedures (Signed)
[name], [name]             ACCOUNT NO.:  [id number]   MEDICAL RECORD NO.:  [id number]          PATIENT TYPE:  REC   LOCATION:  TPC                          FACILITY:  [facility]   PHYSICIAN:  Ranelle Oyster, M.D.DATE OF BIRTH:  [birth year]   DATE OF PROCEDURE:  11/12/2005  DATE OF DISCHARGE:                                 OPERATIVE REPORT   PROCEDURE:  Synvisc injection.   DIAGNOSIS:  Left knee osteoarthritis (715.96).   After informed consent and sterile preparation with Betadine, we injected  the left knee with 2 mL of aqueous phenol solution.  The patient tolerated  the procedure without incident.  Aspiration technique was used.  The area  was cleaned and dressed after injection.  This was the first of three  injections.  Continue with her current care otherwise.  See her back in  approximately two weeks' time.      Ranelle Oyster, M.D.  Electronically Signed     ZTS/MEDQ  D:  11/12/2005 15:16:27  T:  11/13/2005 16:10:96  Job:  [id number]

## 2011-03-22 NOTE — Assessment & Plan Note (Signed)
[facility]   NAME:  [name], [name]             ACCOUNT NO.:  [id number]   MEDICAL RECORD NO.:  [id number]            DATE OF BIRTH:   PHYSICIAN:  Theresia Majors. Tanda [name], M.D. VISIT DATE:  04/14/2007                                   OFFICE VISIT   SUBJECTIVE:  [name] is an 77 year old lady with a Wagner grade 3  diabetic foot ulcer involving the left foot.  We have recommended that  she begin hyperbaric oxygen treatment.  In the interim, we have treated  her with topical Iodosorb and dry dressing with antiseptic soap washes.  She returns for followup while awaiting a slot to dive.  There has been  no interim drainage, malodor or fever.  She has began to increase her  activity.   Blood pressure is 161/71, respirations are 18, pulse rate 87,  temperature is 97.1.  Capillary blood glucose is 174 mg%.  Inspection of  the left foot shows that there are areas of liquefying necrosis, which  were sharply debrided with a 10 blade with hemorrhage control with  direct pressure.  The patient tolerated this procedure very well.  There  was healthy-appearing bleeding at the inner face of necrosis and viable  tissue.  There is no evidence of ascending infection.  There is no  evidence of vascular compromise.   ASSESSMENT:  Wagner grade 3 diabetic foot ulcer.   PLAN:  We have recommended that we proceed with hyperbaric oxygen  treatment as permitted by the dive schedule.  In addition, we have  changed her wound care to a moist, moist dressing daily.      [name], M.D.  Electronically Signed     HAN/MEDQ  D:  04/14/2007  T:  04/14/2007  Job:  [id number]
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
## 2011-03-22 NOTE — Assessment & Plan Note (Signed)
MEDICAL RECORD NUMBER:  [id number]   DATE OF VISIT:  August 28, 2005   INTERVAL HISTORY:  [name] is here in followup of her bilateral knee  osteoarthritis.  She has had progressive return of her pain, particularly in  the left knee over the last nine months.  The Synvisc injections helped for  several months but over the last two really her pain has increased.  She  rates her pain at 6/10.  She is limited with her walking.  She uses a cane  for balance.  She has lost some weight and is down to around 180 pounds.  Pain interferes with general activity, relations with others and enjoyment  of life on a moderate level.  Pain described as sharp and worse with walking  and standing.  She has a hard time transferring sit to stand.   REVIEW OF SYSTEMS:  Positive for high blood pressure, occasional  constipation, urine retention.  Other items are negative and in the health  and history section of the chart.   SOCIAL HISTORY:  Patient is divorced and lives alone.  Daughter is here  today for support.   PHYSICAL EXAMINATION:  Blood pressure is 164/70, pulse is 80, respiratory  rate 16, she is saturating 100% in room air.  Patient is pleasant and seems  to have lost further weight compared to last visit.  She is alert and  oriented x3.  Affect is appropriate.  She continues to walk with a flexed  posture and is antalgic to the left leg.  She uses a quad cane for support.  The left knee and right knee have no instability with provocative maneuvers.  No obvious crepitus was felt.  She had full range of motion.  Strength was  generally 4+/5 on the left and 5/5 at the right knee.   ASSESSMENT:  1.  Bilateral osteoarthritis of the knees.  2.  Obesity.   PLAN:  1.  We will set up for Synvisc injections a series of three to both knees.  2.  Continue weight control.  3.  Once again recommended glucosamine/chondroitin over the counter.  4.  Consider long-acting pain medication depending on results  with the      Synvisc.      Ranelle Oyster, M.D.  Electronically Signed     ZTS/MedQ  D:  08/28/2005 12:08:30  T:  08/28/2005 16:11:08  Job #:  [id number]   cc:   Lonzo Cloud. Kriste Basque, M.D. [facility]  [address]  [place]  [place] [address]

## 2011-03-22 NOTE — Assessment & Plan Note (Signed)
[facility]   NAME:  [name], [name]             ACCOUNT NO.:  [id number]   MEDICAL RECORD NO.:  [id number]      DATE OF BIRTH:  04/18/1922   PHYSICIAN:  Theresia Majors. Tanda Rockers, M.D.      VISIT DATE:                                   OFFICE VISIT   [name] is an 77 year old lady who we are treating with hyperbaric  oxygen treatment for a Wagner grade 3 diabetic foot ulcer.  She has  completed her first dive today.  She denies excessive pressure.  There  were no cardiorespiratory symptoms.   OBJECTIVE:  Her blood pressure was 139/59, pulse rate 61, respirations  16, temperature 97.6, capillary blood glucose post HBO was 93 mg%.   Inspection of the wound shows that there were areas of necrosis, which  underwent a full-thickness debridement with a #10 blade under EMLA  block.  The patient tolerated the procedure well.  The necrotic tissue  was completely removed.  We have reapplied a moist-moist dressing.   ASSESSMENT:  Wagner grade 3 diabetic foot ulcer.   PLAN:  We will continue her HBO as ordered.  We will do a wound  reevaluation in 5 days.      Harold A. Tanda Rockers, M.D.  Electronically Signed     HAN/MEDQ  D:  04/21/2007  T:  04/21/2007  Job:  [id number]
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
## 2011-03-22 NOTE — Procedures (Signed)
[name], Norma             ACCOUNT NO.:  [id number]   MEDICAL RECORD NO.:  [id number]          PATIENT TYPE:  REC   LOCATION:  [facility]                          FACILITY:  [facility]   PHYSICIAN:  Ranelle Oyster, M.D.DATE OF BIRTH:  [birth year]   DATE OF PROCEDURE:  12/09/2005  DATE OF DISCHARGE:                                 OPERATIVE REPORT   PROCEDURE:  Synvisc injection to the left knee, diagnostic code 715.96.   DESCRIPTION OF PROCEDURE:  After informed consent and sterile preparation  with Betadine and aspiration, we injected the left knee with 2 mL of aqueous  phenol solution using a 2-inch, 21-gauge needle.  The patient tolerated this  well.  The area was cleaned and dressed after injection.  This is the third  of three injections.  She will continue with exercise and diet at home.  Post injection instructions were given.  I will see her back in about three  months' time in follow-up.      Ranelle Oyster, M.D.  Electronically Signed     ZTS/MEDQ  D:  12/09/2005 11:29:23  T:  12/09/2005 12:53:30  Job:  [id number]
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
## 2011-03-22 NOTE — Assessment & Plan Note (Signed)
[facility]   NAME:  [name], [name]             ACCOUNT NO.:  [id number]   MEDICAL RECORD NO.:  [id number]      DATE OF BIRTH:  [birth year]   PHYSICIAN:  Theresia Majors. Tanda Rockers, M.D.      VISIT DATE:                                   OFFICE VISIT   SUBJECTIVE:  [name] is an 77 year old lady with a Wagner grade 3  diabetic foot ulcer of the left foot.  During her last visit, we  recommended that the patient be prepared for hyperbaric oxygen treatment  and requested that a family member accompany her for explanation and to  obtain informed consent.  In the interim, there has been no excessive  drainage.  She continues to use Iodosorb gel.  There has been no fever.   OBJECTIVE:  Blood pressure is 161/68, respirations are 18, pulse rate  82, temperature 98.1.  Capillary blood glucose is 62 mg percent.   Inspection of the left foot shows that the wound remains open with a dry  eschar which is covered with the Iodosorb gel.  There is minimum  inflammatory reaction at the periphery.  The pedal pulse is  indeterminate, but the capillary refill is brisk.  The patient remains  relatively insensate to the [facility] filament.   Review of her PTCO2 measurement shows a resting hypoxemia in the peri-  wound area with a  healthy response to supplemental oxygen.  The  patient's chest x-ray shows no evidence of bullous disease.  The  electrocardiogram shows no acute changes.  The CBC and the B-Met are  within normal limits with exception of a relatively low blood sugar  consistent with insulin administration.   ASSESSMENT:  Wagner grade 3 diabetic foot ulcer.   PLAN:  We have reviewed the indications for hyperbaric oxygen treatment  with the patient, [name], and her daughter.  We have specifically  addressed concerns of claustrophobia, oxygen toxicity and barotrauma.  We have reviewed the requirements and the recommendation for 30  hyperbaric oxygen treatments to be  administered over a six-week period.  Both the daughter and the patient have been given an opportunity to ask  questions, they indicate that they understand and are agreeable to  proceed with the therapy as outlined.  We will continue the Iodosorb  dressing changes daily with weekly followup in the wound center and we  will begin her hyperbaric oxygen treatment as soon as the dive schedule  permits.  The orders have been completed.  The informed consent has been  signed.      Harold A. Tanda Rockers, M.D.  Electronically Signed     HAN/MEDQ  D:  04/07/2007  T:  04/07/2007  Job:  [id number]

## 2011-03-22 NOTE — Assessment & Plan Note (Signed)
Monday, September 29, 2006:   [name] is back regarding her bilateral knee pain and new low back pain.  She had a fall about four weeks ago.  She was given pain medication and  muscle relaxants by Dr. Kriste Basque.  She seems to feel the Vicodin is doing  pretty well for her now.  She is walking with a walker in the house,  receiving home health therapy which is about to end.  She states that  she has needs additionally but the person is no longer there and she  still needs some help with the basic activities of daily living as well  as house up keep.  The patient relates her pain today as a 1 out of 10,  described as aching.  Pain is mostly in the knees as well as low back  and occasionally the abdomen.  Pain interferes with general activity,  enjoyment of life, and interactions with others  on a mild level.  The  patient states that she fell initially when she was trying to move a  speaker in her dining room and she lost grip and fell backwards landing  on her buttocks.  The patient denies any pain into the legs or arms  radiating from the back.   REVIEW OF SYSTEMS:  Pertinent positives are listed above.  Full review  is in the health history section.   SOCIAL HISTORY:  The patient is divorced and lives at home with her  grandson who works during the day.   PHYSICAL EXAMINATION:  VITAL SIGNS:  Blood pressure is 141/46.  Pulse is  78, respiratory rate 16.  She is sating 99% on room air.  GENERAL:  The patient is pleasant, no acute distress.  It appears that  she may have lost some weight.  Affect is generally alert and  appropriate.  She is in a wheelchair today.  Knee stability was actually  good.  She transferred from a seated to standing position better than I  had seen her in the past.  Edema was decreased in both lower extremities  and only trace today.  Mild knee swelling is appreciated but even this  appeared lower.  Motor function generally 4 to 4+ out of 5 in the legs  today.   Cognitively she was appropriate.  She had good standing balance  for me today without an adaptive device.   ASSESSMENT:  1. Osteoarthritis of bilateral knees.  2. Mild obesity.  3. Hypertension.  4. History of fall with likely back contusion and muscle spasms.  The      patient with no documented bony injury per her report.   PLAN:  1. It is all right with me to stay with Vicodin for now.  She seems to      be tolerating this well.  It seems to have improved her quality of      life as well.  She will use Vicodin 5/500 one q. 12 hours p.r.n.      essentially.  2. Encouraged ongoing walker use rather than cane to improve her      balance and decrease fall risk.  PT is completing therapy.  3. The patient needs home health aid to assist with house up keep and      personal hygiene tasks.  I am not sure that they are still coming      to the house.  4. I will see her back in about two months time.  Ranelle Oyster, M.D.  Electronically Signed     ZTS/MedQ  D:  09/29/2006 10:32:23  T:  09/29/2006 11:44:37  Job #:  [id number]
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
## 2011-03-22 NOTE — Procedures (Signed)
[name], [name]             ACCOUNT NO.:  [id number]   MEDICAL RECORD NO.:  [id number]          PATIENT TYPE:  REC   LOCATION:  [facility]                          FACILITY:  [facility]   PHYSICIAN:  Ranelle Oyster, M.D.DATE OF BIRTH:  1922-03-03   DATE OF PROCEDURE:  10/02/2004  DATE OF DISCHARGE:                                 OPERATIVE REPORT   PROCEDURE:  Bilateral Synvisc injections, diagnostic code is 715.96.   After informed consent and sterile preparation with Betadine, we injected  after aspiration 5 mL of aqueous Synvisc solution using a 1-1/2 inch 22-  gauge needle.  The patient tolerated this well.  We injected both left and  right knees via the anterolateral approach.  She tolerated this well.  No  bleeding was noted.  The area was cleaned and covered.  She was given post-  injection instructions.  We will see her back for a follow-up injection in  approximately two weeks' time.      Ian Malkin   ZTS/MEDQ  D:  10/02/2004 13:47:34  T:  10/02/2004 14:29:39  Job:  [id number]   cc:   Lonzo Cloud. Kriste Basque, M.D. [facility]

## 2011-03-22 NOTE — Procedures (Signed)
NAMEHALEE, GLYNN             ACCOUNT NO.:  [id number]   MEDICAL RECORD NO.:  [id number]           PATIENT TYPE:   LOCATION:                               FACILITY:  [facility]   PHYSICIAN:  Ranelle Oyster, M.D.DATE OF BIRTH:  [birth year]   DATE OF PROCEDURE:  11/12/2004  DATE OF DISCHARGE:                                 OPERATIVE REPORT   VISIT:   PROCEDURE:  Synvisc injection.   OPERATOR:  Ranelle Oyster, M.D.   DIAGNOSIS:  Bilateral osteoarthritis of the knees - code #715.96.   DESCRIPTION OF PROCEDURE:  After an informed consent, and a sterile  preparation of both knees using Betadine, we injected each knee with 2 mL of  prepared Synvisc aqueous solution.  The patient tolerated both injections  well.  We injected laterally on the left and medially on the right.  The  patient had minimal bleeding or bruising.  The area was cleaned and  bandaged.   DISPOSITION:  Post-injection instructions were given.  The patient will see  me back in three months for followup.      Ian Malkin   ZTS/MEDQ  D:  11/12/2004 13:55:23  T:  11/12/2004 14:42:55  Job:  [id number]

## 2011-03-22 NOTE — Procedures (Signed)
NAMEGERALD, [name]             ACCOUNT NO.:  [id number]   MEDICAL RECORD NO.:  [id number]          PATIENT TYPE:  REC   LOCATION:  [facility]                          FACILITY:  [facility]   PHYSICIAN:  Ranelle Oyster, M.D.DATE OF BIRTH:  08-17-1922   DATE OF PROCEDURE:  11/22/2005  DATE OF DISCHARGE:                                 OPERATIVE REPORT   PROCEDURE:  Synvisc injection. Diagnostic code 715.96.   DESCRIPTION OF PROCEDURE:  After informed consent and sterile preparation  with Betadine, we injected the left knee with 2 cc of aqueous phenol  solution using a 1-1/2-inch 21-gauge needle. The patient tolerated this well  without incident. Aspiration technique was utilized. The area was cleaned  and dressed after injection. This is the second of three injections.  Continue with her current care, otherwise. She has already experienced some  relief with the first injection. I will see her back in two weeks' time for  the third injection.      Ranelle Oyster, M.D.  Electronically Signed     ZTS/MEDQ  D:  11/22/2005 11:58:35  T:  11/22/2005 14:23:41  Job:  [id number]

## 2011-03-22 NOTE — Assessment & Plan Note (Signed)
MEDICAL RECORD NUMBER:  [id number]   [name] is back regarding her chronic knee pain.  She had benefits with the  Synvisc for about 2 months.  The left knee has begun to cause pain again.  She rates her pain overall at a 4/10 to 5/10.  She describes pain as  intermittent and burning.  It interferes with general activity, relations  with others, and enjoyment of life on a mild level.  Sleep is good.  She  states pain increases generally with walking.  She does walk with her cane.  She does leg exercises at home.  She has lost some weight and is down to  about 175 pounds the last time she had her weight checked.  The patient is  not really using anything for pain per se except for an occasional Tylenol.  I believe she had used Darvocet at one point in the past.  We have not done  an injection with steroid for about 2+ years at this point.  The patient  does not want surgery on her knee.  She wants to stay with the conservative  plan.   REVIEW OF SYSTEMS:  Positive for weakness, high blood sugars at times.  Some  recent increase in her blood pressure.  Full review of systems is in the  health and history section of the chart.   SOCIAL HISTORY:  The patient is divorced and lives alone.   PHYSICAL EXAMINATION:  Blood pressure is 145/40, pulse is 74, respiratory  rate 16, she is saturating 98% in room air.  The patient is pleasant, in no  acute distress.  She is alert and oriented x3.  Affect is bright and  appropriate.  Gait is antalgic to either side, left greater than right.  She  uses a cane for balance.  Coordination is fair.  Reflexes are 1+.  Sensation  is within normal limits.  She appears to have lost some weight. Edema has  decreased in her legs.  There is some mild swelling in the infrapatellar  region, left greater than right today.  No knee instability per se was seen.  Some crepitus was felt with range of motion.  Motor function was 4/5 in the  upper extremities, 4/5 to 4+/5 in  the lower extremities today.   ASSESSMENT:  1.  Osteoarthritis of the knees, left greater than right.  2.  Mild obesity.  3.  Hypertension.   PLAN:  1.  After informed consent we injected the left knee with 40 mg of Kenalog      and 30 mL of 1% lidocaine.  The patient tolerated this well without side      effects or complications.  2.  I gave the patient a trial of Limbrel, a new LOX-5 inhibitor which      should be safe from a medical standpoint for her.  We will try 500 mg      b.i.d.  3.  I gave the patient a few Darvocet-N 100 to try on a daily basis p.r.n.      for severe breakthrough pain.  4.  Encouraged ongoing strengthening and exercise as well as further weight      loss.  I would like to see her lose another 20-25 pounds safely.  5.  I will see her back in about 3 months' time.      Ranelle Oyster, M.D.  Electronically Signed     ZTS/MedQ  D:  03/03/2006 09:59:42  T:  03/03/2006  14:06:52  Job #:  [id number]

## 2011-03-22 NOTE — Assessment & Plan Note (Signed)
[name] is back regarding her bilateral knee pain.  She continues to have  problems with the left knee.  We injected it with Kenalog in April and this  lasted for a few weeks.  She does seem to like the Darvocet, but has been  out of these for 2 months.  She states the Darvocet helped her pain  throughout the day and did not cause any drowsiness or other side effects.  She is sleeping generally well.  Knees are most painful with standing,  walking and bending.  She rates the pain as a 3-4/10 when she walks.   REVIEW OF SYSTEMS:  Patient reports trouble walking, decreased appetite to a  certain extent.  Other pertinent positives listed above _ in the Health and  History Section.   SOCIAL HISTORY:  Without change.   PHYSICAL EXAM:  Blood pressure is 170/85, with a recheck 147/62.  Pulse 80,  respiratory rate 16, she is sating 99% on room air.  Patient is pleasant, in  no acute distress.  Affect is bright and appropriate.  She has an antalgic  wide-based gait favoring the left side.  Uses a cane to help with her  balance.  Coordination is fair.  Reflexes are 1+.  Sensation is normal.  Edema is decreased and only trace in the legs currently.  No instability or  crepitus was seen on the left side or the right side today.  She had some  mild peripatellar swelling.  Motor function was generally 4/5 to 4+/5 in  both legs today, as it was in the upper extremities as well.   ASSESSMENT:  1.  Osteoarthritis of both knees, left greater than right.  2.  Mild obesity.  3.  Hypertension.   PLAN:  1.  Will continue with Darvocet daily p.r.n. for pain, as she seems to be      handling this well.  I would prefer not to inject the knees again today.  2.  Continue with weight control and diet as well as exercise.  3.  I will see the patient back in 4 months time.  4.  Recommend strict blood pressure control as well.      Ranelle Oyster, M.D.  Electronically Signed     ZTS/MedQ  D:   06/02/2006 10:23:27  T:  06/02/2006 11:34:14  Job #:  [id number]
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
## 2011-03-22 NOTE — Discharge Summary (Signed)
NAMEWEI, NEWBROUGH                         ACCOUNT NO.:  [id number]   MEDICAL RECORD NO.:  [id number]                   PATIENT TYPE:  INP   LOCATION:  5158                                 FACILITY:  [facility]   PHYSICIAN:  Reather Littler, M.D.                    DATE OF BIRTH:  04-01-1922   DATE OF ADMISSION:  06/09/2002  DATE OF DISCHARGE:  06/11/2002                                 DISCHARGE SUMMARY   HISTORY:  The patient was admitted for severe hypoglycemia.  The patient was  found unresponsive at home and was brought to the emergency room.  The  patient required two injections of 50% dextrose to revive her.  Apparently,  the patient's blood sugar was over 200 on the day of admission but she does  not know why it dropped.  She apparently had been having decreased appetite  over the last few weeks.   PAST MEDICAL HISTORY:  See HPI.   REVIEW OF SYSTEMS:  See HPI.   MEDICATIONS:  See HPI.   PHYSICAL EXAMINATION:  GENERAL:  The patient was alert and able to converse.  VITAL SIGNS:  Blood pressure 190/95, temperature 92.5 degrees, pulse 80.  O2  saturation 100%.  The patient looked pale and somewhat diaphoretic.  She was  slightly slow in responding.  HEART:  Normal.  LUNGS:  Normal.  ABDOMEN:  Normal without any mass.  She had mild left upper quadrant  tenderness.  EXTREMITIES:  Normal.   HOSPITAL COURSE:  The patient was hydrated and the next morning her blood  sugar, which was over 200, was treated with NPH insulin b.i.d., as well as  resuming her Prandin and Actos.  Her potassium was replaced.  She was sent  for a CT scan of her abdomen to rule out pancreatic abnormalities, which was  normal.  Also, on the day of discharge, gastric emptying scan was scheduled.   She recovered with her blood sugar on discharge 187 and her nausea and  anorexia improving.  She was started on Reglan and Protonix while in the  hospital.   LABORATORY DATA:  Her potassium was 3.3 on August 7.   Hemoglobin 16, repeat  13.  Glucose 112 on the night of admission and 216 the next morning.  Amylase normal.  AST 42, slightly high.  CPK 221.  Albumin 2.9.   EKG normal.  X-ray reports as above.   DISCHARGE DIAGNOSES:  1. Severe hypoglycemia and hypothermia secondary to poor intake and insulin     usage.  2. Probable gastroparesis.  3. Obesity.  4. History of hypertension.   DISCHARGE CONDITION:  Improved.   DISCHARGE MEDICATIONS:  The patient to stop Lantus insulin and start Novolin  NPH insulin 6 units in the morning and 6 at bedtime.  She will also resume  her Prandin, Actos, and antihypertensives.  She will be treated with  Prilosec  one a day and Reglan four times a day.   FOLLOW UP:  She will be followed up in one week.                                                  Reather Littler, M.D.    AK/MEDQ  D:  06/11/2002  T:  06/16/2002  Job:  [id number]
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
## 2011-03-22 NOTE — Procedures (Signed)
NAMEVANASSA, PENNIMAN             ACCOUNT NO.:  [id number]   MEDICAL RECORD NO.:  [id number]          PATIENT TYPE:  REC   LOCATION:  TPC                          FACILITY:  [facility]   PHYSICIAN:  Ranelle Oyster, M.D.DATE OF BIRTH:  01-29-1922   DATE OF PROCEDURE:  10/23/2004  DATE OF DISCHARGE:                                 OPERATIVE REPORT   MEDICAL RECORD NUMBER:  [id number].   DIAGNOSES:  Bilateral osteoarthritis of the knees, ICD-9 715.96.   PROCEDURE:  Bilateral Synvisc injections.   After informed consent and sterile preparation with Betadine utilizing  aspiration technique and anterolateral approach, we injected each knee using  approximately 2 cc of prepared Synvisc aqueous solution. The patient  tolerated injections well on both sides. Only minor bleeding was noted.  Postinjection instructions were given.   The patient will follow up with [place] in approximately 2 weeks' time for her  third series of injections.      Ian Malkin   ZTS/MEDQ  D:  10/23/2004 13:54:36  T:  10/24/2004 12:42:16  Job:  [id number]   cc:   Lonzo Cloud. Kriste Basque, M.D. [facility]

## 2011-07-25 LAB — URINALYSIS, ROUTINE W REFLEX MICROSCOPIC
Bilirubin Urine: NEGATIVE
Hgb urine dipstick: NEGATIVE
Ketones, ur: NEGATIVE
Nitrite: NEGATIVE
pH: 7

## 2011-07-25 LAB — DIFFERENTIAL
Lymphocytes Relative: 10 — ABNORMAL LOW
Lymphs Abs: 0.7
Monocytes Relative: 5
Neutro Abs: 6.2
Neutrophils Relative %: 85 — ABNORMAL HIGH

## 2011-07-25 LAB — CBC
Hemoglobin: 12.7
MCHC: 34.7
MCV: 98.2
RDW: 12.8

## 2011-07-25 LAB — COMPREHENSIVE METABOLIC PANEL
ALT: 8
BUN: 5 — ABNORMAL LOW
Calcium: 8.8
Creatinine, Ser: 0.55
Glucose, Bld: 48 — ABNORMAL LOW
Sodium: 138
Total Protein: 6.8

## 2011-08-09 LAB — COMPREHENSIVE METABOLIC PANEL
ALT: 17 U/L (ref 0–35)
AST: 32 U/L (ref 0–37)
Alkaline Phosphatase: 76 U/L (ref 39–117)
CO2: 28 mEq/L (ref 19–32)
GFR calc Af Amer: >60 mL/min (ref >60)
GFR calc non Af Amer: >60 mL/min (ref >60)
Glucose, Bld: 126 mg/dL — ABNORMAL HIGH (ref 70–99)
Potassium: 4.4 mEq/L (ref 3.5–5.1)
Sodium: 138 mEq/L (ref 135–145)

## 2011-08-09 LAB — CBC
HCT: 35 % — ABNORMAL LOW (ref 36.0–46.0)
HCT: 38 % (ref 36.0–46.0)
Hemoglobin: 10.9 g/dL — ABNORMAL LOW (ref 12.0–15.0)
Hemoglobin: 11.9 g/dL — ABNORMAL LOW (ref 12.0–15.0)
Hemoglobin: 12.8 g/dL (ref 12.0–15.0)
MCHC: 33.6 g/dL (ref 30.0–36.0)
MCHC: 34.1 g/dL (ref 30.0–36.0)
MCHC: 34.2 g/dL (ref 30.0–36.0)
MCHC: 34.7 g/dL (ref 30.0–36.0)
MCV: 100.3 fL — ABNORMAL HIGH (ref 78.0–100.0)
MCV: 100.3 fL — ABNORMAL HIGH (ref 78.0–100.0)
Platelets: 190 10*3/uL (ref 150–400)
Platelets: 196 10*3/uL (ref 150–400)
RBC: 3.51 MIL/uL — ABNORMAL LOW (ref 3.87–5.11)
RBC: 3.55 MIL/uL — ABNORMAL LOW (ref 3.87–5.11)
RBC: 3.79 MIL/uL — ABNORMAL LOW (ref 3.87–5.11)
RDW: 12 % (ref 11.5–15.5)
RDW: 12 % (ref 11.5–15.5)
RDW: 12.1 % (ref 11.5–15.5)
WBC: 7.9 10*3/uL (ref 4.0–10.5)

## 2011-08-09 LAB — DIFFERENTIAL
Basophils Absolute: 0.1 10*3/uL (ref 0.0–0.1)
Basophils Relative: 0 % (ref 0–1)
Basophils Relative: 1 % (ref 0–1)
Eosinophils Absolute: 0 10*3/uL (ref 0.0–0.7)
Eosinophils Absolute: 0 10*3/uL (ref 0.0–0.7)
Eosinophils Relative: 0 % (ref 0–5)
Lymphs Abs: 0.5 10*3/uL — ABNORMAL LOW (ref 0.7–4.0)
Monocytes Relative: 4 % (ref 3–12)
Monocytes Relative: 8 % (ref 3–12)
Neutro Abs: 7 10*3/uL (ref 1.7–7.7)
Neutrophils Relative %: 85 % — ABNORMAL HIGH (ref 43–77)
Neutrophils Relative %: 89 % — ABNORMAL HIGH (ref 43–77)

## 2011-08-09 LAB — GLUCOSE, CAPILLARY
Glucose-Capillary: 109 mg/dL — ABNORMAL HIGH (ref 70–99)
Glucose-Capillary: 122 mg/dL — ABNORMAL HIGH (ref 70–99)
Glucose-Capillary: 133 mg/dL — ABNORMAL HIGH (ref 70–99)
Glucose-Capillary: 141 mg/dL — ABNORMAL HIGH (ref 70–99)
Glucose-Capillary: 144 mg/dL — ABNORMAL HIGH (ref 70–99)
Glucose-Capillary: 149 mg/dL — ABNORMAL HIGH (ref 70–99)
Glucose-Capillary: 154 mg/dL — ABNORMAL HIGH (ref 70–99)
Glucose-Capillary: 170 mg/dL — ABNORMAL HIGH (ref 70–99)
Glucose-Capillary: 170 mg/dL — ABNORMAL HIGH (ref 70–99)
Glucose-Capillary: 175 mg/dL — ABNORMAL HIGH (ref 70–99)
Glucose-Capillary: 177 mg/dL — ABNORMAL HIGH (ref 70–99)
Glucose-Capillary: 181 mg/dL — ABNORMAL HIGH (ref 70–99)
Glucose-Capillary: 187 mg/dL — ABNORMAL HIGH (ref 70–99)
Glucose-Capillary: 192 mg/dL — ABNORMAL HIGH (ref 70–99)
Glucose-Capillary: 202 mg/dL — ABNORMAL HIGH (ref 70–99)
Glucose-Capillary: 203 mg/dL — ABNORMAL HIGH (ref 70–99)
Glucose-Capillary: 233 mg/dL — ABNORMAL HIGH (ref 70–99)
Glucose-Capillary: 275 mg/dL — ABNORMAL HIGH (ref 70–99)
Glucose-Capillary: 72 mg/dL (ref 70–99)

## 2011-08-09 LAB — URINE CULTURE: Colony Count: 40000

## 2011-08-09 LAB — URINALYSIS, ROUTINE W REFLEX MICROSCOPIC
Bilirubin Urine: NEGATIVE
Hgb urine dipstick: NEGATIVE
Ketones, ur: 15 mg/dL — AB
Nitrite: NEGATIVE
Specific Gravity, Urine: 1.017 (ref 1.005–1.030)
pH: 7 (ref 5.0–8.0)

## 2011-08-09 LAB — CK TOTAL AND CKMB (NOT AT ARMC): CK, MB: 3.7 ng/mL (ref 0.3–4.0)

## 2011-08-09 LAB — BASIC METABOLIC PANEL
BUN: 43 mg/dL — ABNORMAL HIGH (ref 6–23)
BUN: 6 mg/dL (ref 6–23)
BUN: 8 mg/dL (ref 6–23)
CO2: 26 mEq/L (ref 19–32)
CO2: 26 mEq/L (ref 19–32)
CO2: 28 mEq/L (ref 19–32)
Calcium: 8.7 mg/dL (ref 8.4–10.5)
Calcium: 8.9 mg/dL (ref 8.4–10.5)
Calcium: 9.3 mg/dL (ref 8.4–10.5)
Chloride: 96 mEq/L (ref 96–112)
Creatinine, Ser: 0.52 mg/dL (ref 0.4–1.2)
Creatinine, Ser: 0.62 mg/dL (ref 0.4–1.2)
Creatinine, Ser: 1.2 mg/dL (ref 0.4–1.2)
GFR calc non Af Amer: 43 mL/min — ABNORMAL LOW (ref >60)
GFR calc non Af Amer: >60 mL/min (ref >60)
Glucose, Bld: 149 mg/dL — ABNORMAL HIGH (ref 70–99)
Glucose, Bld: 85 mg/dL (ref 70–99)
Glucose, Bld: 89 mg/dL (ref 70–99)
Sodium: 138 mEq/L (ref 135–145)

## 2011-08-09 LAB — APTT: aPTT: 33 seconds (ref 24–37)

## 2011-08-09 LAB — PROTIME-INR
INR: 1.1 (ref 0.00–1.49)
Prothrombin Time: 14.8 seconds (ref 11.6–15.2)

## 2011-08-09 LAB — MAGNESIUM: Magnesium: 1.7 mg/dL (ref 1.5–2.5)

## 2011-08-09 LAB — TROPONIN I: Troponin I: 0.01 ng/mL (ref 0.00–0.06)

## 2011-08-09 LAB — URINE MICROSCOPIC-ADD ON

## 2011-08-09 LAB — PHOSPHORUS: Phosphorus: 2.7 mg/dL (ref 2.3–4.6)

## 2011-08-09 LAB — LIPASE, BLOOD: Lipase: 22 U/L (ref 11–59)

## 2011-08-30 DIAGNOSIS — E559 Vitamin D deficiency, unspecified: Secondary | ICD-10-CM

## 2011-08-30 HISTORY — DX: Vitamin D deficiency, unspecified: E55.9

## 2011-11-22 DIAGNOSIS — J209 Acute bronchitis, unspecified: Secondary | ICD-10-CM

## 2011-11-22 HISTORY — DX: Acute bronchitis, unspecified: J20.9

## 2012-03-11 ENCOUNTER — Ambulatory Visit (HOSPITAL_COMMUNITY)
Admission: RE | Admit: 2012-03-11 | Discharge: 2012-03-11 | Disposition: A | Discharge status: Home / Self Care | Payer: Medicare Other | Source: Ambulatory Visit | Attending: Internal Medicine | Admitting: Internal Medicine

## 2012-03-11 DIAGNOSIS — F411 Generalized anxiety disorder: Secondary | ICD-10-CM | POA: Insufficient documentation

## 2012-03-11 DIAGNOSIS — R1310 Dysphagia, unspecified: Secondary | ICD-10-CM | POA: Insufficient documentation

## 2012-03-11 DIAGNOSIS — K219 Gastro-esophageal reflux disease without esophagitis: Secondary | ICD-10-CM | POA: Insufficient documentation

## 2012-03-11 DIAGNOSIS — I10 Essential (primary) hypertension: Secondary | ICD-10-CM | POA: Insufficient documentation

## 2012-03-11 DIAGNOSIS — K589 Irritable bowel syndrome without diarrhea: Secondary | ICD-10-CM | POA: Insufficient documentation

## 2012-03-11 DIAGNOSIS — F0280 Dementia in other diseases classified elsewhere without behavioral disturbance: Secondary | ICD-10-CM | POA: Insufficient documentation

## 2012-03-11 DIAGNOSIS — E119 Type 2 diabetes mellitus without complications: Secondary | ICD-10-CM | POA: Insufficient documentation

## 2012-03-11 NOTE — Procedures (Signed)
Objective Swallowing Evaluation: Modified Barium Swallowing Study  Patient Details  Name: [name] MRN: [id number] Date of Birth: Nov 02, 1922  Today's Date: 03/11/2012 Time: 7829-5621 SLP Time Calculation (min): 15 min  Past Medical History: No past medical history on file. Past Surgical History: No past surgical history on file. HPI:  78 year old female referred for outpatient MBS from [facility] to evaluate risk of aspiration and determine safe diet level. PMH significant for DM, anxiety, HTN, GERD, IBS, dementia, DJD, back pain. obesity, Parkinson's Disease, UTI.   Assessment / Plan / Recommendation Clinical Impression  Dysphagia Diagnosis: Mild oral phase dysphagia;Moderate pharyngeal phase dysphagia Clinical impression: Mild oral, moderate pharyngeal sensory and motor based dysphagia, characterized by slow oral prep and propulsion, delayed swallow reflex, and ASPIRATION of thin and nectar thick liquids with INCONSISTENT COUGH RESPONSE TO ASPIRATION EPISODES.  Pt is at high risk for continued aspiration, due to Parkinson's diagnosis.  At this time, pt appears safe for soft chopped foods with honey thick liquids.  Thinner consistencies are NOT recommended, even with SLP in dysphagia therapy, as pt reflexive response is not reliable.. Pt able to tolerate meds whole, one at a time, given with puree consistency.    Treatment Recommendation  Defer treatment plan to SLP at [name])    Diet Recommendation Dysphagia 2 (Fine chop);Honey-thick liquid   Other  Recommendations     Follow Up Recommendations  Skilled Nursing facility (per primary SLP)                 SLP Swallow Goals  Defer to SLP at facility   General HPI: 78 year old female referred for outpatient MBS from [facility] to evaluate risk of aspiration and determine safe diet level. PMH significant for DM, anxiety, HTN, GERD, IBS, dementia, DJD, back pain. obesity, Parkinson's  Disease, UTI. Type of Study: Modified Barium Swallowing Study Previous Swallow Assessment: None found. Diet Prior to this Study: Other (Comment) (Pt unable to state current diet.) Temperature Spikes Noted: No Respiratory Status: Room air History of Intubation: No Behavior/Cognition: Alert;Cooperative;Confused;Pleasant mood;Decreased sustained attention Oral Cavity - Dentition: Dentures, top;Dentures, bottom Oral Motor / Sensory Function: Within functional limits Vision: Functional for self-feeding Patient Positioning: Upright in chair Baseline Vocal Quality: Clear Volitional Cough: Weak Volitional Swallow: Unable to elicit Anatomy: Within functional limits Pharyngeal Secretions: Not observed secondary MBS    Reason for Referral To assess swallow function and safety, and identify safe diet level.  Oral Phase     Pharyngeal Phase Pharyngeal Phase: Impaired   Cervical Esophageal Phase Cervical Esophageal Phase: WFL     Nezar Buckles B. [place], [facility], CCC-SLP [phone]  Leigh Aurora 03/11/2012, 1:47 PM

## 2013-03-10 ENCOUNTER — Encounter: Payer: Self-pay | Admitting: Adult Health

## 2013-03-10 ENCOUNTER — Non-Acute Institutional Stay (SKILLED_NURSING_FACILITY): Payer: Medicare Other | Admitting: Adult Health

## 2013-03-10 DIAGNOSIS — E559 Vitamin D deficiency, unspecified: Secondary | ICD-10-CM

## 2013-03-10 DIAGNOSIS — F0150 Vascular dementia without behavioral disturbance: Secondary | ICD-10-CM

## 2013-03-10 DIAGNOSIS — E119 Type 2 diabetes mellitus without complications: Secondary | ICD-10-CM

## 2013-03-10 DIAGNOSIS — F411 Generalized anxiety disorder: Secondary | ICD-10-CM

## 2013-03-10 DIAGNOSIS — I10 Essential (primary) hypertension: Secondary | ICD-10-CM

## 2013-03-10 DIAGNOSIS — H409 Unspecified glaucoma: Secondary | ICD-10-CM | POA: Insufficient documentation

## 2013-03-10 DIAGNOSIS — R1310 Dysphagia, unspecified: Secondary | ICD-10-CM

## 2013-03-10 DIAGNOSIS — M1990 Unspecified osteoarthritis, unspecified site: Secondary | ICD-10-CM

## 2013-03-10 DIAGNOSIS — G20 Parkinson's disease: Secondary | ICD-10-CM

## 2013-03-10 DIAGNOSIS — G20A1 Parkinson's disease without dyskinesia, without mention of fluctuations: Secondary | ICD-10-CM

## 2013-03-10 NOTE — Assessment & Plan Note (Signed)
No signs of aspiration; will continue honey thick liquids

## 2013-03-10 NOTE — Assessment & Plan Note (Signed)
Will continue vit d 2000 units daily

## 2013-03-10 NOTE — Assessment & Plan Note (Signed)
Will continue her tylenol 1 gm twice daily routinely

## 2013-03-10 NOTE — Assessment & Plan Note (Signed)
Will continue lexapro 10 mg daily

## 2013-03-10 NOTE — Assessment & Plan Note (Signed)
Will continue cosopt to both eyes twice daily  

## 2013-03-10 NOTE — Assessment & Plan Note (Signed)
Will continue sinemet 25/100 1/2 tab three times daily

## 2013-03-10 NOTE — Progress Notes (Signed)
Patient ID: [name], female   DOB: 10/14/1922, 79 y.o.   MRN: [id number] Allergies  Allergen Reactions  . Fish Allergy   . Iodine       Chief Complaint  Patient presents with  . Medical Managment of Chronic Issues    HPI: She is being seen for management of her chronic illnesses; she is without significant in her status. She is tolerating her medications without difficulty; there are no reports of hypoglycemia present. There have been no incidents of aspiration present.    Past Medical History  Diagnosis Date  . Fall   . FTT (failure to thrive) in adult   . Anemia   . Diabetes mellitus without complication   . Anxiety   . Glaucoma     History reviewed. No pertinent past surgical history.  VITAL SIGNS BP 140/78  Pulse 86  Ht 5\' 4"  (1.626 m)  Wt 160 lb (72.576 kg)  BMI 27.45 kg/m2   Patient's Medications  New Prescriptions   No medications on file  Previous Medications   ACETAMINOPHEN (TYLENOL) 500 MG TABLET    Take 1,000 mg by mouth 2 (two) times daily.   ASPIRIN 81 MG TABLET    Take 81 mg by mouth daily.   CARBIDOPA-LEVODOPA (SINEMET IR) 25-100 MG PER TABLET    Take 1 tablet by mouth every 8 (eight) hours. Take 1/2 tab   CHOLECALCIFEROL (VITAMIN D) 1000 UNITS TABLET    Take 2,000 Units by mouth daily.   DORZOLAMIDE-TIMOLOL (COSOPT) 22.3-6.8 MG/ML OPHTHALMIC SOLUTION    Place 1 drop into both eyes 2 (two) times daily.   ESCITALOPRAM (LEXAPRO) 10 MG TABLET    Take 10 mg by mouth daily.   FOOD THICKENER (THICK IT) POWD    Take 1 Container by mouth as needed (for honey thick liquids).   INSULIN ASPART (NOVOLOG) 100 UNIT/ML INJECTION    Inject 5 Units into the skin 3 (three) times daily with meals. For cbg >=150 prior to meals   INSULIN DETEMIR (LEVEMIR) 100 UNIT/ML INJECTION    Inject 12 Units into the skin at bedtime.   LISINOPRIL (PRINIVIL,ZESTRIL) 10 MG TABLET    Take 10 mg by mouth daily.   MAGNESIUM HYDROXIDE (MILK OF MAGNESIA) 400 MG/5ML SUSPENSION    Take  30 mLs by mouth daily as needed for constipation.   MEMANTINE (NAMENDA) 10 MG TABLET    Take 10 mg by mouth 2 (two) times daily.   MULTIPLE VITAMIN (MULTIVITAMIN) TABLET    Take 1 tablet by mouth daily.  Modified Medications   No medications on file  Discontinued Medications   No medications on file    SIGNIFICANT DIAGNOSTIC EXAMS   01-01-13: glucose 110; bun 24; creat 0.80; k+ 4.7; na++ 134; hgb a1c 6.7  02-07-13: urine culture: p. Mirabilis: cipro  Review of Systems  Unable to perform ROS    Physical Exam  Constitutional: She appears well-nourished.  Neck: Neck supple.  Cardiovascular: Normal rate, regular rhythm and intact distal pulses.   Respiratory: Effort normal and breath sounds normal.  GI: Soft. Bowel sounds are normal.  Musculoskeletal: Normal range of motion.  Neurological: She is alert.  To self only  Skin: Skin is warm and dry.  Psychiatric: She has a normal mood and affect.       ASSESSMENT/ PLAN:    HYPERTENSION Is stable will continue lisinopril 10 mg daily and asa 81 mg daily  will monitor her status  Vascular dementia No significant change in status  will change the namenda to the xr formulation 28 mg daily and will monitor her status   DIABETES MELLITUS Will continue levemir 12 units daily and will continue novolog 5 units prior to meals for cbg >=150  ANXIETY Will continue lexapro 10 mg daily   PARKINSON'S DISEASE Will continue sinemet 25/100 1/2 tab three times daily   DEGENERATIVE JOINT DISEASE Will continue her tylenol 1 gm twice daily routinely  VITAMIN D DEFICIENCY Will continue vit d 2000 units daily   Glaucoma Will continue cosopt to both eyes twice daily   Dysphagia, unspecified No signs of aspiration; will continue honey thick liquids

## 2013-03-10 NOTE — Assessment & Plan Note (Addendum)
Is stable will continue lisinopril 10 mg daily and asa 81 mg daily  will monitor her status

## 2013-03-10 NOTE — Assessment & Plan Note (Signed)
Will continue levemir 12 units daily and will continue novolog 5 units prior to meals for cbg >=150

## 2013-03-10 NOTE — Assessment & Plan Note (Signed)
No significant change in status will change the namenda to the xr formulation 28 mg daily and will monitor her status

## 2013-05-21 ENCOUNTER — Non-Acute Institutional Stay (SKILLED_NURSING_FACILITY): Payer: Medicare Other | Admitting: Internal Medicine

## 2013-05-21 DIAGNOSIS — K219 Gastro-esophageal reflux disease without esophagitis: Secondary | ICD-10-CM

## 2013-05-21 DIAGNOSIS — G20A1 Parkinson's disease without dyskinesia, without mention of fluctuations: Secondary | ICD-10-CM

## 2013-05-21 DIAGNOSIS — M1990 Unspecified osteoarthritis, unspecified site: Secondary | ICD-10-CM

## 2013-05-21 DIAGNOSIS — E119 Type 2 diabetes mellitus without complications: Secondary | ICD-10-CM

## 2013-05-21 DIAGNOSIS — F411 Generalized anxiety disorder: Secondary | ICD-10-CM

## 2013-05-21 DIAGNOSIS — G20 Parkinson's disease: Secondary | ICD-10-CM

## 2013-05-21 DIAGNOSIS — I10 Essential (primary) hypertension: Secondary | ICD-10-CM

## 2013-05-21 NOTE — Progress Notes (Signed)
Patient ID: [name], female   DOB: 03-01-1922, 79 y.o.   MRN: [id number]   Allergies  Allergen Reactions  . Fish Allergy   . Iodine     Chief Complaint  Patient presents with  . Medical Managment of Chronic Issues    Code status- dnr  HPI:  79 y/o female patient is here for long term. She was seen in her room today. There has been no concerns from the staff. No complaints from the patient  Review of Systems  Constitutional: Negative for fever and chills.  Respiratory: Negative for cough and shortness of breath.   Cardiovascular: Negative for chest pain.  Gastrointestinal: Negative for heartburn, nausea, vomiting and abdominal pain.  Genitourinary: Negative for dysuria.  Musculoskeletal: Negative for myalgias and falls.  Skin: Negative for rash.  Neurological: Negative for dizziness, loss of consciousness, weakness and headaches.  Psychiatric/Behavioral: Negative for depression.     Past Medical History  Diagnosis Date  . Fall   . FTT (failure to thrive) in adult   . Anemia   . Diabetes mellitus without complication   . Anxiety   . Glaucoma    No past surgical history on file. Social History:   reports that she has never smoked. She does not have any smokeless tobacco history on file. Her alcohol and drug histories are not on file.  Family History  Problem Relation Age of Onset  . Family history unknown: Yes    Medications: Patient's Medications  New Prescriptions   No medications on file  Previous Medications   ACETAMINOPHEN (TYLENOL) 500 MG TABLET    Take 1,000 mg by mouth 2 (two) times daily.   ASPIRIN 81 MG TABLET    Take 81 mg by mouth daily.   CARBIDOPA-LEVODOPA (SINEMET IR) 25-100 MG PER TABLET    Take 1 tablet by mouth every 8 (eight) hours. Take 1/2 tab   CHOLECALCIFEROL (VITAMIN D) 1000 UNITS TABLET    Take 2,000 Units by mouth daily.   DORZOLAMIDE-TIMOLOL (COSOPT) 22.3-6.8 MG/ML OPHTHALMIC SOLUTION    Place 1 drop into both eyes 2 (two) times  daily.   ESCITALOPRAM (LEXAPRO) 10 MG TABLET    Take 10 mg by mouth daily.   FOOD THICKENER (THICK IT) POWD    Take 1 Container by mouth as needed (for honey thick liquids).   INSULIN ASPART (NOVOLOG) 100 UNIT/ML INJECTION    Inject 5 Units into the skin 3 (three) times daily with meals. For cbg >=150 prior to meals   INSULIN DETEMIR (LEVEMIR) 100 UNIT/ML INJECTION    Inject 10 Units into the skin at bedtime.    LISINOPRIL (PRINIVIL,ZESTRIL) 10 MG TABLET    Take 10 mg by mouth daily.   MAGNESIUM HYDROXIDE (MILK OF MAGNESIA) 400 MG/5ML SUSPENSION    Take 30 mLs by mouth daily as needed for constipation.   MEMANTINE HCL ER (NAMENDA XR) 28 MG CP24    Take 1 capsule by mouth daily.   MULTIPLE VITAMIN (MULTIVITAMIN) TABLET    Take 1 tablet by mouth daily.  Modified Medications   No medications on file  Discontinued Medications   MEMANTINE (NAMENDA) 10 MG TABLET    Take 10 mg by mouth 2 (two) times daily.     Physical Exam:  Filed Vitals:   05/21/13 1151  BP: 110/60  Pulse: 70  Temp: 98 F (36.7 C)  Resp: 18  Height: 5\' 4"  (1.626 m)  Weight: 155 lb (70.308 kg)  SpO2: 96%   Constitutional: She  appears well-nourished.  Neck: Neck supple.  Cardiovascular: Normal rate, regular rhythm and intact distal pulses.   Respiratory: Effort normal and breath sounds normal.  GI: Soft. Bowel sounds are normal.  Musculoskeletal: Normal range of motion.  Neurological: She is alert.  To self only  Skin: Skin is warm and dry.  Psychiatric: She has a normal mood and affect.   Labs reviewed:  2/14 na 134, k 4.7, bun 24, ca 8.7, a1c 6.7, cr 0.8  Assessment/Plan  DIABETES MELLITUS continue levemir 10 units daily and novolog 5 units prior to meals for cbg >=150, monitor cbg, check a1c. Continue aspirin. Will have her on lipitor 10 mg daily  ANXIETY Will continue lexapro 10 mg daily and monitor clinically  HYPERTENSION continue lisinopril 10 mg daily and asa 81 mg daily   Vascular dementia No  significant change in status , continue namenda xr  PARKINSON'S DISEASE Will continue sinemet 25/100 1/2 tab three times daily   DEGENERATIVE JOINT DISEASE Will continue her tylenol 1 gm twice daily routinely  VITAMIN D DEFICIENCY Will continue vit d 2000 units daily   Glaucoma Will continue cosopt to both eyes twice daily   Dysphagia, unspecified No signs of aspiration; will continue honey thick liquids with food thickener   Labs/tests ordered- cbc, cmp, a1c, flp

## 2013-06-07 ENCOUNTER — Encounter: Payer: Self-pay | Admitting: *Deleted

## 2013-06-24 ENCOUNTER — Non-Acute Institutional Stay (SKILLED_NURSING_FACILITY): Payer: Medicare Other | Admitting: Internal Medicine

## 2013-06-24 DIAGNOSIS — I10 Essential (primary) hypertension: Secondary | ICD-10-CM

## 2013-06-24 DIAGNOSIS — R1310 Dysphagia, unspecified: Secondary | ICD-10-CM

## 2013-06-24 DIAGNOSIS — G20 Parkinson's disease: Secondary | ICD-10-CM

## 2013-06-24 DIAGNOSIS — F0150 Vascular dementia without behavioral disturbance: Secondary | ICD-10-CM

## 2013-06-24 DIAGNOSIS — E119 Type 2 diabetes mellitus without complications: Secondary | ICD-10-CM

## 2013-06-24 DIAGNOSIS — K219 Gastro-esophageal reflux disease without esophagitis: Secondary | ICD-10-CM

## 2013-06-24 NOTE — Progress Notes (Signed)
Patient ID: [name], female   DOB: 09/09/1922, 79 y.o.   MRN: [id number]  Chief Complaint  Patient presents with  . Medical Managment of Chronic Issues  . Hypertension   [facility] [place] [facility]  Allergies  Allergen Reactions  . Fish Allergy   . Iodine     Code status- DNR  HPI:   79 y/o female patient is here for long term. She was seen in her room today. Her blood pressure has been elevated recently. Last month her bp had been running on lower side of normal and her antihypertensive losartan- hctz was switched to lisinopril. Her bp continued to be on lower side and thus her lisiopril was also discontinued on 06/04/13. Her bp has been slowly rising up since then. Pt seen in her room today. Denies any headache, chets pain, SOB or abdominal pain at present. There has been no other concerns from the staff.   Review of Systems  Constitutional: Negative for fever and chills.  Respiratory: Negative for cough and shortness of breath.   Cardiovascular: Negative for chest pain.  Gastrointestinal: Negative for heartburn, nausea, vomiting and abdominal pain.  Genitourinary: Negative for dysuria.  Musculoskeletal: Negative for myalgias and falls.  Skin: Negative for rash.  Neurological: Negative for dizziness, loss of consciousness, weakness and headaches.  Psychiatric/Behavioral: Negative for depression.    Medications reviewed.  Current Outpatient Prescriptions on File Prior to Visit  Medication Sig Dispense Refill  . acetaminophen (TYLENOL) 500 MG tablet Take 1,000 mg by mouth 2 (two) times daily.      Marland Kitchen aspirin 81 MG tablet Take 81 mg by mouth daily.      . carbidopa-levodopa (SINEMET IR) 25-100 MG per tablet Take 1 tablet by mouth every 8 (eight) hours. Take 1/2 tab      . cholecalciferol (VITAMIN D) 1000 UNITS tablet Take 2,000 Units by mouth daily.      . dorzolamide-timolol (COSOPT) 22.3-6.8 MG/ML ophthalmic solution Place 1 drop into both eyes 2 (two) times daily.      Marland Kitchen  escitalopram (LEXAPRO) 10 MG tablet Take 10 mg by mouth daily.      . food thickener (THICK IT) POWD Take 1 Container by mouth as needed (for honey thick liquids).      . insulin aspart (NOVOLOG) 100 UNIT/ML injection Inject 5 Units into the skin 3 (three) times daily with meals. For cbg >=150 prior to meals      . insulin detemir (LEVEMIR) 100 UNIT/ML injection Inject 10 Units into the skin at bedtime.       . magnesium hydroxide (MILK OF MAGNESIA) 400 MG/5ML suspension Take 30 mLs by mouth daily as needed for constipation.      . Memantine HCl ER (NAMENDA XR) 28 MG CP24 Take 1 capsule by mouth daily.      . Multiple Vitamin (MULTIVITAMIN) tablet Take 1 tablet by mouth daily.       No current facility-administered medications on file prior to visit.    Physical exam  BP 191/94  Pulse 88  Temp(Src) 97 F (36.1 C)  Resp 16  SpO2 96%  Constitutional: She appears well-nourished.   Neck: Neck supple.   Cardiovascular: Normal rate, regular rhythm and intact distal pulses.    Respiratory: Effort normal and breath sounds normal.   GI: Soft. Bowel sounds are normal.  Musculoskeletal: Normal range of motion.  Neurological: She is alert.  To self only   Skin: Skin is warm and dry.  Psychiatric: She has a  normal mood and affect.   Labs reviewed:  2/14 na 134, k 4.7, bun 24, ca 8.7, a1c 6.7, cr 0.8  05/24/13 na 134, k 4.4, glu 66, bun 19, cr 0.82, alp 124, t.chol 119, tg 44, ldl 58  8/4 a1c 6.9  Assessment/Plan  Hypertension With elevated bp, will give her clonidine 0.1 mg po x 1 now. Will have her on amlodipine 5 mg daily for now and then clonidine once a day for SBP >180 only. Reviewed recent bmp which is normal. Continue bp check  DIABETES MELLITUS a1c 6.9. cbg has been 106- 270. Change levemir to 8 units daily and novolog 5 units prior to meals for cbg >=150, monitor cbg. Continue aspirin and lipitor 10 mg daily  Vascular dementia No significant change in status , continue  namenda xr  PARKINSON'S DISEASE Will continue sinemet 25/100 1/2 tab three times daily  ANXIETY Will continue lexapro 10 mg daily and monitor clinically  Dysphagia, unspecified No signs of aspiration; will continue honey thick liquids with food thickener  DEGENERATIVE JOINT DISEASE Will continue her tylenol 1 gm twice daily routinely  VITAMIN D DEFICIENCY Will continue vit d 2000 units daily   Glaucoma Will continue cosopt to both eyes twice daily

## 2013-07-13 ENCOUNTER — Non-Acute Institutional Stay (SKILLED_NURSING_FACILITY): Payer: Medicare Other | Admitting: Internal Medicine

## 2013-07-13 DIAGNOSIS — I10 Essential (primary) hypertension: Secondary | ICD-10-CM

## 2013-07-13 DIAGNOSIS — G20A1 Parkinson's disease without dyskinesia, without mention of fluctuations: Secondary | ICD-10-CM

## 2013-07-13 DIAGNOSIS — G20 Parkinson's disease: Secondary | ICD-10-CM

## 2013-07-13 DIAGNOSIS — IMO0002 Reserved for concepts with insufficient information to code with codable children: Secondary | ICD-10-CM

## 2013-07-13 DIAGNOSIS — E119 Type 2 diabetes mellitus without complications: Secondary | ICD-10-CM

## 2013-07-13 DIAGNOSIS — F0150 Vascular dementia without behavioral disturbance: Secondary | ICD-10-CM

## 2013-07-13 NOTE — Progress Notes (Signed)
Patient ID: [name], female   DOB: Oct 10, 1922, 79 y.o.   MRN: [id number] Location:  [facility] Provider:  Gwenith Spitz. Renato Gails, D.O., C.M.D.  Code Status:  DNR  Chief Complaint  Patient presents with  . Acute Visit    right second toe swollen, draining with foul odor (was debrided by podiatry and toenail and part of skin removed)    HPI:  79 yo black female with h/o end stages of Parkinson's disease seen for acute visit as above.   Review of Systems:  Review of Systems  Unable to perform ROS: dementia    Medications: Patient's Medications  New Prescriptions   No medications on file  Previous Medications   ACETAMINOPHEN (TYLENOL) 500 MG TABLET    Take 1,000 mg by mouth 2 (two) times daily.   ALUM & MAG HYDROXIDE-SIMETH (MAALOX/MYLANTA) 200-200-20 MG/5ML SUSPENSION    Take 30 mLs by mouth every 6 (six) hours as needed for indigestion or heartburn.   AMLODIPINE (NORVASC) 5 MG TABLET    Take 5 mg by mouth daily.   CARBIDOPA-LEVODOPA (SINEMET IR) 25-100 MG PER TABLET    Take 0.5 tablets by mouth every 8 (eight) hours.    CHOLECALCIFEROL 2000 UNITS CAPS    Take 1 capsule by mouth daily.   CLONIDINE (CATAPRES) 0.1 MG TABLET    Take 0.1 mg by mouth daily as needed (sbp >180).   DORZOLAMIDE-TIMOLOL (COSOPT) 22.3-6.8 MG/ML OPHTHALMIC SOLUTION    Place 1 drop into both eyes 2 (two) times daily.   INSULIN ASPART (NOVOLOG) 100 UNIT/ML INJECTION    Inject 5 Units into the skin 3 (three) times daily with meals. For cbg >=150 prior to meals   INSULIN DETEMIR (LEVEMIR) 100 UNIT/ML INJECTION    Inject 10 Units into the skin at bedtime.    MAGNESIUM HYDROXIDE (MILK OF MAGNESIA) 400 MG/5ML SUSPENSION    Take 30 mLs by mouth daily as needed for constipation.   MULTIPLE VITAMIN (MULTIVITAMIN) TABLET    Take 1 tablet by mouth daily.  Modified Medications   No medications on file  Discontinued Medications   ASPIRIN 81 MG TABLET    Take 81 mg by mouth daily.   CHOLECALCIFEROL  (VITAMIN D) 1000 UNITS TABLET    Take 2,000 Units by mouth daily.   ESCITALOPRAM (LEXAPRO) 10 MG TABLET    Take 10 mg by mouth daily.   FOOD THICKENER (THICK IT) POWD    Take 1 Container by mouth as needed (for honey thick liquids).   MEMANTINE HCL ER (NAMENDA XR) 28 MG CP24    Take 1 capsule by mouth daily.    Physical Exam: Filed Vitals:   07/13/13 0948  BP: 139/81  Pulse: 108  Temp: 97.8 F (36.6 C)  Resp: 18  Height: 5\' 4"  (1.626 m)  Weight: 157 lb (71.215 kg)   Physical Exam  Constitutional: She appears well-developed and well-nourished. No distress.  Cardiovascular: Normal rate, regular rhythm and normal heart sounds.   Pulmonary/Chest: Effort normal and breath sounds normal. No respiratory distress.  Abdominal: Soft. Bowel sounds are normal. She exhibits no distension and no mass. There is no tenderness.  Musculoskeletal: Normal range of motion. She exhibits tenderness.  Of right second toe  Neurological: She is alert.  Resting tremor  Skin:  Right second toe with erythema, warmth, drainage of tip of toe   Psychiatric: She has a normal mood and affect.   BP ranging 120s-160s/60s-90s  Labs reviewed: 05/24/13:  Na 134, K  4.4, BUn 19, cr 0.82;  TC 119, HDL 52, LDL 58, TG 44 06/07/13:  hba1c 6.9 CBGs ranging 84-292 w/o a fixed pattern  Assessment/Plan 1. Paronychia -Keflex 250mg  po qid x 7 days for paronychia, florastor 250mg  po bid x 10 days Cbc, bmp next draw Floating heels in bed  2. Unspecified essential hypertension -notify md/np if sbp remains over 150/90 x 3 days -BP ranging 120s-160s/60s-90s  3. Paralysis agitans -cont sinemet  4. Type II or unspecified type diabetes mellitus without mention of complication, not stated as uncontrolled Cont current insulin dosing and concho diet Has routine hba1c august, nov, feb, may  5. Vascular dementia Followed by [facility], off meds for this at this time  Family/ staff Communication: daughter notified of plan  Goals  of care: DNR  Labs/tests ordered:  Cbc, bmp, hba1c

## 2013-08-11 ENCOUNTER — Encounter: Payer: Self-pay | Admitting: Vascular Surgery

## 2013-08-12 ENCOUNTER — Other Ambulatory Visit: Payer: Self-pay | Admitting: *Deleted

## 2013-08-12 ENCOUNTER — Ambulatory Visit (HOSPITAL_COMMUNITY)
Admission: RE | Admit: 2013-08-12 | Discharge: 2013-08-12 | Disposition: A | Discharge status: Home / Self Care | Payer: Medicare Other | Source: Ambulatory Visit | Attending: Vascular Surgery | Admitting: Vascular Surgery

## 2013-08-12 ENCOUNTER — Ambulatory Visit (INDEPENDENT_AMBULATORY_CARE_PROVIDER_SITE_OTHER): Payer: Medicare Other | Admitting: Vascular Surgery

## 2013-08-12 ENCOUNTER — Encounter: Payer: Self-pay | Admitting: Vascular Surgery

## 2013-08-12 VITALS — BP 169/53 | HR 83 | Resp 16

## 2013-08-12 DIAGNOSIS — L98499 Non-pressure chronic ulcer of skin of other sites with unspecified severity: Secondary | ICD-10-CM

## 2013-08-12 DIAGNOSIS — I70209 Unspecified atherosclerosis of native arteries of extremities, unspecified extremity: Secondary | ICD-10-CM

## 2013-08-12 DIAGNOSIS — I739 Peripheral vascular disease, unspecified: Secondary | ICD-10-CM

## 2013-08-12 NOTE — Progress Notes (Signed)
[facility] [place] HISTORY AND PHYSICAL   History of Present Illness:  Patient is a 79 y.o. year old female who presents for evaluation of painful gangrenous right second toe.  The patient is a poor historian. She is demented. There is no family at the office visit today. She is with a caregiver from [facility] who also does not know much of her history. The patient states she does have pain in the right second toe.  Other medical problems include anemia, diabetes, glaucoma, bronchitis, dementia, hypertension all of which are currently stable  Past Medical History  Diagnosis Date  . Fall   . FTT (failure to thrive) in adult   . Anemia   . Diabetes mellitus without complication   . Anxiety   . Glaucoma   . Acute bronchitis 11/22/2011  . Unspecified vitamin D deficiency 08/30/2011  . Vascular dementia, uncomplicated 02/26/2011  . Unspecified late effects of cerebrovascular disease 11/18/2008  . Reflux esophagitis 11/18/2008  . Ulcer of other part of foot 11/18/2008  . Shortness of breath 11/18/2008  . Paralysis agitans 11/03/2008  . Unspecified essential hypertension 11/03/2008  . Irritable bowel syndrome 11/03/2008  . Ulcer     right second toe    No past surgical history on file.  Social History History  Substance Use Topics  . Smoking status: Never Smoker   . Smokeless tobacco: Never Used  . Alcohol Use: No    Family History No family history on file.  Allergies  Allergies  Allergen Reactions  . Fish Allergy   . Iodine      Current Outpatient Prescriptions  Medication Sig Dispense Refill  . acetaminophen (TYLENOL) 500 MG tablet Take 1,000 mg by mouth 2 (two) times daily.      Marland Kitchen amLODipine (NORVASC) 5 MG tablet Take 5 mg by mouth daily.      Marland Kitchen aspirin 81 MG tablet Take 81 mg by mouth daily.      . carbidopa-levodopa (SINEMET IR) 25-100 MG per tablet Take 1 tablet by mouth every 8 (eight) hours. Take 1/2 tab      . cholecalciferol  (VITAMIN D) 1000 UNITS tablet Take 2,000 Units by mouth daily.      . cloNIDine (CATAPRES) 0.1 MG tablet Take 0.1 mg by mouth daily as needed (sbp >180).      . dorzolamide-timolol (COSOPT) 22.3-6.8 MG/ML ophthalmic solution Place 1 drop into both eyes 2 (two) times daily.      Marland Kitchen escitalopram (LEXAPRO) 10 MG tablet Take 10 mg by mouth daily.      . food thickener (THICK IT) POWD Take 1 Container by mouth as needed (for honey thick liquids).      . insulin aspart (NOVOLOG) 100 UNIT/ML injection Inject 5 Units into the skin 3 (three) times daily with meals. For cbg >=150 prior to meals      . insulin detemir (LEVEMIR) 100 UNIT/ML injection Inject 8 Units into the skin at bedtime.       . magnesium hydroxide (MILK OF MAGNESIA) 400 MG/5ML suspension Take 30 mLs by mouth daily as needed for constipation.      . Memantine HCl ER (NAMENDA XR) 28 MG CP24 Take 1 capsule by mouth daily.      . Multiple Vitamin (MULTIVITAMIN) tablet Take 1 tablet by mouth daily.       No current facility-administered medications for this visit.    ROS:   Unable to obtain review of systems due to patient's dementia.  Physical Examination  Filed Vitals:   08/12/13 1313  BP: 169/53  Pulse: 83  Resp: 16    General:  Answers questions alert and oriented to person  HEENT: Normal Pulmonary: Clear to auscultation bilaterally Cardiac: Regular Rate and Rhythm  Abdomen: Soft, non-tender, non-distended Skin: No rash, gangrene right second toe extending to the base of the toe, no erythema Extremity Pulses:  2+ radial, brachial, femoral, absent dorsalis pedis, posterior tibial pulses bilaterally Musculoskeletal: No deformity or edema  Neurologic: Upper and lower extremity motor 5/5 and symmetric  DATA:  Right ABI and arterial duplex report from mobile X. is reviewed today this was performed on October 1. Duplex shows evidence of distal tibial disease with an ABI of 0.66. Images are not available for  review   ASSESSMENT:  Gangrene right second toe and a demented severely disabled patient who is wheelchair-bound. I discussed the situation with the patient's daughter Elvera Maria today. I believe the patient needs any dictation of her right second toe. However, there is probably a 50% chance that this will not heal. If the toe amputation does not heal she would be faced with a right above-knee amputation. This was discussed at length the patient's daughter today. Her daughter is going out of the country today. She wishes to schedule the procedure for October 22. Risks benefits possible complications and procedure details were explained to the daughter today. She will be scheduled for toe amputation October 22.   PLAN:  See above  Fabienne Bruns, MD [facility] [place] Office: [phone] Pager: [phone]

## 2013-08-18 ENCOUNTER — Encounter: Payer: Self-pay | Admitting: Vascular Surgery

## 2013-08-19 ENCOUNTER — Other Ambulatory Visit: Payer: Self-pay

## 2013-08-19 ENCOUNTER — Non-Acute Institutional Stay (SKILLED_NURSING_FACILITY): Payer: Medicare Other | Admitting: Internal Medicine

## 2013-08-19 ENCOUNTER — Encounter: Payer: Self-pay | Admitting: Internal Medicine

## 2013-08-19 ENCOUNTER — Ambulatory Visit: Payer: Medicare Other | Admitting: Vascular Surgery

## 2013-08-19 DIAGNOSIS — L98499 Non-pressure chronic ulcer of skin of other sites with unspecified severity: Secondary | ICD-10-CM

## 2013-08-19 DIAGNOSIS — I739 Peripheral vascular disease, unspecified: Secondary | ICD-10-CM

## 2013-08-19 DIAGNOSIS — I798 Other disorders of arteries, arterioles and capillaries in diseases classified elsewhere: Secondary | ICD-10-CM

## 2013-08-19 DIAGNOSIS — G20A1 Parkinson's disease without dyskinesia, without mention of fluctuations: Secondary | ICD-10-CM

## 2013-08-19 DIAGNOSIS — I10 Essential (primary) hypertension: Secondary | ICD-10-CM

## 2013-08-19 DIAGNOSIS — F0150 Vascular dementia without behavioral disturbance: Secondary | ICD-10-CM

## 2013-08-19 DIAGNOSIS — I70209 Unspecified atherosclerosis of native arteries of extremities, unspecified extremity: Secondary | ICD-10-CM

## 2013-08-19 DIAGNOSIS — G20 Parkinson's disease: Secondary | ICD-10-CM

## 2013-08-19 DIAGNOSIS — E1159 Type 2 diabetes mellitus with other circulatory complications: Secondary | ICD-10-CM

## 2013-08-19 NOTE — Progress Notes (Signed)
Patient ID: [name], female   DOB: [birth year], 78 y.o.   MRN: [id number]  [name] [place]  Code status- dnr  Chief Complaint  Patient presents with  . Medical Managment of Chronic Issues   Allergies  Allergen Reactions  . Fish Allergy   . Iodine     HPI:   79 y/o female patient is here for long term care. She was seen in her room today. She has developed gangrene in right 2nd toe. She has dementia and is a poor historian. She complaints of pain in her right second toe. Her blood pressure reading have improved. cbg between 175-260 with few reading in 300 and 400 Reviewed notes from vascular surgery and plan is for toe amputation on 08/25/13. There has been no other concerns from the staff.   Review of Systems  Constitutional: Negative for fever and chills.   Respiratory: Negative for cough and shortness of breath.    Cardiovascular: Negative for chest pain.   Gastrointestinal: Negative for heartburn, nausea, vomiting and abdominal pain.   Genitourinary: Negative for dysuria.   Musculoskeletal: Negative for myalgias and falls.   Skin: Negative for rash.   Neurological: Negative for dizziness, loss of consciousness, weakness and headaches.   Psychiatric/Behavioral: Negative for depression.   Past Medical History  Diagnosis Date  . Fall   . FTT (failure to thrive) in adult   . Anemia   . Diabetes mellitus without complication   . Anxiety   . Glaucoma   . Acute bronchitis 11/22/2011  . Unspecified vitamin D deficiency 08/30/2011  . Vascular dementia, uncomplicated 02/26/2011  . Unspecified late effects of cerebrovascular disease 11/18/2008  . Reflux esophagitis 11/18/2008  . Ulcer of other part of foot 11/18/2008  . Shortness of breath 11/18/2008  . Paralysis agitans 11/03/2008  . Unspecified essential hypertension 11/03/2008  . Irritable bowel syndrome 11/03/2008  . Ulcer     right second toe   History reviewed. No pertinent past surgical  history. Current Outpatient Prescriptions on File Prior to Visit  Medication Sig Dispense Refill  . acetaminophen (TYLENOL) 500 MG tablet Take 1,000 mg by mouth 2 (two) times daily.      Marland Kitchen amLODipine (NORVASC) 5 MG tablet Take 5 mg by mouth daily.      Marland Kitchen aspirin 81 MG tablet Take 81 mg by mouth daily.      . carbidopa-levodopa (SINEMET IR) 25-100 MG per tablet Take 1 tablet by mouth every 8 (eight) hours. Take 1/2 tab      . cholecalciferol (VITAMIN D) 1000 UNITS tablet Take 2,000 Units by mouth daily.      . cloNIDine (CATAPRES) 0.1 MG tablet Take 0.1 mg by mouth daily as needed (sbp >180).      . dorzolamide-timolol (COSOPT) 22.3-6.8 MG/ML ophthalmic solution Place 1 drop into both eyes 2 (two) times daily.      Marland Kitchen escitalopram (LEXAPRO) 10 MG tablet Take 10 mg by mouth daily.      . food thickener (THICK IT) POWD Take 1 Container by mouth as needed (for honey thick liquids).      . insulin aspart (NOVOLOG) 100 UNIT/ML injection Inject 5 Units into the skin 3 (three) times daily with meals. For cbg >=150 prior to meals      . insulin detemir (LEVEMIR) 100 UNIT/ML injection Inject 8 Units into the skin at bedtime.       . magnesium hydroxide (MILK OF MAGNESIA) 400 MG/5ML suspension Take 30 mLs by mouth daily as  needed for constipation.      . Memantine HCl ER (NAMENDA XR) 28 MG CP24 Take 1 capsule by mouth daily.      . Multiple Vitamin (MULTIVITAMIN) tablet Take 1 tablet by mouth daily.       No current facility-administered medications on file prior to visit.    BP 125/60  Pulse 78  Temp(Src) 97.8 F (36.6 C)  Resp 18  Ht 5\' 4"  (1.626 m)  Wt 155 lb (70.308 kg)  BMI 26.59 kg/m2  SpO2 96%  Constitutional: She appears well-nourished HEENT: no pallor, no icterus, no LAD, MMM Cardiovascular: Normal rate, regular rhythm and intact distal pulses.    Respiratory: Effort normal and breath sounds normal.   GI: Soft. Bowel sounds are normal.  Musculoskeletal: Normal range of motion. No  edema. Neurological: She is alert.  To self only   Skin: Skin is warm and dry. No rash. Has gangrene in right second toe with extension upto the base. Tender on exam. Palpable posterior tibialis pulses but absent dorsalis pedis in right foot Psychiatric: She has a normal mood and affect.   Labs reviewed:  2/14 na 134, k 4.7, bun 24, ca 8.7, a1c 6.7, cr 0.8  05/24/13 na 134, k 4.4, glu 66, bun 19, cr 0.82, alp 124, t.chol 119, tg 44, ldl 58  8/4 a1c 6.9  Imaging Right ABI and arterial duplex report from mobile X. is reviewed today this was performed on October 1. Duplex shows evidence of distal tibial disease with an ABI of 0.66. Images are not available for review   Assessment/Plan  Right toe gangrene Will undergo amputation on 08/25/13. Reviewed notes from dr fields  Hypertension Improved, continue amlodipine 5 mg daily for now and clonidine  DIABETES MELLITUS a1c 6.9. Given slightly high blood sugar reading and upcoming toe amputation., will change levemir to 10 u to get tighter sugar control for now. continue novolog 5 units prior to meals for cbg >=150, monitor cbg. Continue aspirin and lipitor 10 mg daily. Check a1c in nov with bmp  PARKINSON'S DISEASE Will continue sinemet 25/100 1/2 tab three times daily  Mood disorder continue lexapro for now  Vascular dementia No significant change in status , continue memantine  Dysphagia, unspecified No signs of aspiration; will continue honey thick liquids with food thickener  Spent 50 minutes in patient care

## 2013-08-24 ENCOUNTER — Encounter (HOSPITAL_COMMUNITY): Payer: Self-pay | Admitting: *Deleted

## 2013-08-24 ENCOUNTER — Non-Acute Institutional Stay (SKILLED_NURSING_FACILITY): Payer: Medicare Other | Admitting: Internal Medicine

## 2013-08-24 DIAGNOSIS — I798 Other disorders of arteries, arterioles and capillaries in diseases classified elsewhere: Secondary | ICD-10-CM

## 2013-08-24 DIAGNOSIS — G20A1 Parkinson's disease without dyskinesia, without mention of fluctuations: Secondary | ICD-10-CM

## 2013-08-24 DIAGNOSIS — F0280 Dementia in other diseases classified elsewhere without behavioral disturbance: Secondary | ICD-10-CM

## 2013-08-24 DIAGNOSIS — E1159 Type 2 diabetes mellitus with other circulatory complications: Secondary | ICD-10-CM

## 2013-08-24 DIAGNOSIS — G20 Parkinson's disease: Secondary | ICD-10-CM

## 2013-08-24 DIAGNOSIS — I779 Disorder of arteries and arterioles, unspecified: Secondary | ICD-10-CM

## 2013-08-24 DIAGNOSIS — I743 Embolism and thrombosis of arteries of the lower extremities: Secondary | ICD-10-CM

## 2013-08-24 DIAGNOSIS — I96 Gangrene, not elsewhere classified: Secondary | ICD-10-CM

## 2013-08-24 MED ORDER — DEXTROSE 5 % IV SOLN
1.5000 g | INTRAVENOUS | Status: AC
  Administered 2013-08-25: 1.5 g via INTRAVENOUS
  Filled 2013-08-24: qty 1.5

## 2013-08-24 NOTE — Progress Notes (Signed)
Spoke with Gilda Crease, pt nurse, to complete pt pre-op assessment. Nurse denies that the pt has had an EKG or chest x ray within the last year. Nurse denies that pt is under the care of a cardiologist.

## 2013-08-24 NOTE — Progress Notes (Signed)
Patient ID: [name], female   DOB: 18-Dec-1921, 79 y.o.   MRN: [id number] Location:  [facility] Provider:  Gwenith Spitz. Renato Gails, D.O., C.M.D.  Code Status:  DNR   Chief Complaint  Patient presents with  . Acute Visit    her daughter has questions about plans for surgery--pt is for amputation right second toe due to gangrene on 10/22;  ABI 0.66 with distal tibial disease    HPI:  79 yo black female long term care resident with h/o PD, dementia, DMII with PAD was seen today for an acute visit.  She has been seen by vascular surgery due to a nonhealing gangrenous area on her second right toe.  The plan is for amputation of this toe and it it does not heal, she may require amputation of her leg.  Her daughter requested a meeting with me to discuss what led to this unfortunate situation.  [name] had received podiatry services in August and returned to her room with a bandaid on her toe per nursing staff and pt's daughter.  There was no documentation to support what was done, but it appeared her toenails had been trimmed and her right second toe had been injured in the process.  The next week, her daughter noted that the toe was bleeding.  Two weeks ago, a smell was noted.  The toe was found to be gangrenous.  Dr. Lorenz Coaster had noted pus beneath the nail and spoke with her daughter who agreed to the vascular surgery referral and plans for amputation.  We reviewed the unfortunate situation and the overall need for amputation of the toe to prevent spread of gangrene and infection.  We also reviewed her goals of care, and the possibility that she still may need further amputation due to her poor arterial flow in the right lower leg.  She expressed understanding.    Review of Systems:  Review of Systems  Constitutional: Negative for fever, chills and weight loss.       Obtained from daughter and nursing staff impressions  HENT: Negative for congestion.   Respiratory: Negative for shortness  of breath.   Cardiovascular: Negative for chest pain.  Gastrointestinal: Negative for abdominal pain.  Genitourinary: Negative for dysuria.  Musculoskeletal: Negative for myalgias.  Skin: Negative for rash.  Neurological: Negative for dizziness.  Psychiatric/Behavioral: Positive for memory loss.    Medications: Patient's Medications  New Prescriptions   ALUM & MAG HYDROXIDE-SIMETH (MAALOX/MYLANTA) 200-200-20 MG/5ML SUSPENSION    Take 30 mLs by mouth every 6 (six) hours as needed.   CEFUROXIME (CEFTIN) 250 MG TABLET    Take 1 tablet (250 mg total) by mouth 2 (two) times daily.   FEEDING SUPPLEMENT, ENSURE, (ENSURE) PUDG    Take 1 Container by mouth daily.   FEEDING SUPPLEMENT, GLUCERNA SHAKE, (GLUCERNA SHAKE) LIQD    Take 237 mLs by mouth 2 (two) times daily between meals.   TRAMADOL (ULTRAM) 50 MG TABLET    Take 1 tablet (50 mg total) by mouth every 8 (eight) hours as needed for pain.  Previous Medications   ACETAMINOPHEN (TYLENOL) 500 MG TABLET    Take 1,000 mg by mouth 2 (two) times daily.   AMLODIPINE (NORVASC) 5 MG TABLET    Take 5 mg by mouth daily.   CARBIDOPA-LEVODOPA (SINEMET IR) 25-100 MG PER TABLET    Take 0.5 tablets by mouth every 8 (eight) hours.    CHOLECALCIFEROL 2000 UNITS CAPS    Take 1 capsule by mouth daily.  CLONIDINE (CATAPRES) 0.1 MG TABLET    Take 0.1 mg by mouth daily as needed (sbp >180).   DORZOLAMIDE-TIMOLOL (COSOPT) 22.3-6.8 MG/ML OPHTHALMIC SOLUTION    Place 1 drop into both eyes 2 (two) times daily.   ESCITALOPRAM (LEXAPRO) 10 MG TABLET    Take 10 mg by mouth daily.   FOOD THICKENER (THICK IT) POWD    Take 1 Container by mouth as needed (for honey thick liquids).   INSULIN ASPART (NOVOLOG) 100 UNIT/ML INJECTION    Inject 5 Units into the skin 3 (three) times daily with meals. For cbg >=150 prior to meals   INSULIN DETEMIR (LEVEMIR) 100 UNIT/ML INJECTION    Inject 10 Units into the skin at bedtime.    MAGNESIUM HYDROXIDE (MILK OF MAGNESIA) 400 MG/5ML  SUSPENSION    Take 30 mLs by mouth daily as needed for constipation.   MEMANTINE HCL ER (NAMENDA XR) 28 MG CP24    Take 1 capsule by mouth daily.   MULTIPLE VITAMIN (MULTIVITAMIN) TABLET    Take 1 tablet by mouth daily.  Modified Medications   No medications on file  Discontinued Medications   ASPIRIN 81 MG TABLET    Take 81 mg by mouth daily.   CHOLECALCIFEROL (VITAMIN D) 1000 UNITS TABLET    Take 2,000 Units by mouth daily.    Physical Exam: Filed Vitals:   08/24/13 1415  BP: 142/68  Pulse: 80  Temp: 97.9 F (36.6 C)  Resp: 16  Height: 5\' 4"  (1.626 m)  Weight: 155 lb (70.308 kg)   Physical Exam  Constitutional: She appears well-developed and well-nourished. No distress.  Cardiovascular: Normal rate, regular rhythm and normal heart sounds.   Right second toe is gangrenous, black;  Dorsalis pedis pulse diminished  Pulmonary/Chest: Effort normal and breath sounds normal.  Abdominal: Soft. Bowel sounds are normal. She exhibits no distension. There is no tenderness.  Neurological: She is alert.   Significant Diagnostic Results: ABI 0.66  Assessment/Plan 1. Gangrene of toe -plan is for amputation of right second toe by Dr. Darrick Penna  2. Parkinson's disease -stable, advanced stage, goals are comfort  3. Dementia in Parkinson's disease -advanced, minimally conversive, very confused, but pleasant, dependent in adls  4. Peripheral arterial occlusive disease -will limit her wound healing after amputation--risk of needing leg amputation reviewed with her daughter due to his PAD and she expresses understanding that wound will be slow to heal and may not heal properly  5. DM type 2 causing vascular disease -glucose readings fluctuant due to gangrenous toe -no hypoglycemia noted -cont same regimen with levemir and novolog;  Last hba1c 6.9   Family/ staff Communication:  Had care plan meeting with her daughter today  Goals of care: DNR  Total visit time:  40 mins.  More than half  of this time was spent in counseling of patient's family.

## 2013-08-25 ENCOUNTER — Encounter (HOSPITAL_COMMUNITY): Payer: Self-pay | Admitting: Certified Registered"

## 2013-08-25 ENCOUNTER — Ambulatory Visit (HOSPITAL_COMMUNITY): Payer: Medicare Other | Admitting: Certified Registered"

## 2013-08-25 ENCOUNTER — Ambulatory Visit (HOSPITAL_COMMUNITY)
Admission: RE | Admit: 2013-08-25 | Discharge: 2013-08-25 | Disposition: A | Discharge status: Skilled Nursing Facility | Payer: Medicare Other | Source: Ambulatory Visit | Attending: Vascular Surgery | Admitting: Vascular Surgery

## 2013-08-25 ENCOUNTER — Telehealth: Payer: Self-pay | Admitting: Vascular Surgery

## 2013-08-25 ENCOUNTER — Encounter (HOSPITAL_COMMUNITY)
Admission: RE | Disposition: A | Discharge status: Skilled Nursing Facility | Payer: Self-pay | Source: Ambulatory Visit | Attending: Vascular Surgery

## 2013-08-25 ENCOUNTER — Encounter (HOSPITAL_COMMUNITY): Payer: Medicare Other | Admitting: Certified Registered"

## 2013-08-25 DIAGNOSIS — M869 Osteomyelitis, unspecified: Secondary | ICD-10-CM | POA: Insufficient documentation

## 2013-08-25 DIAGNOSIS — Z7982 Long term (current) use of aspirin: Secondary | ICD-10-CM | POA: Insufficient documentation

## 2013-08-25 DIAGNOSIS — I10 Essential (primary) hypertension: Secondary | ICD-10-CM | POA: Diagnosis present

## 2013-08-25 DIAGNOSIS — Z794 Long term (current) use of insulin: Secondary | ICD-10-CM | POA: Insufficient documentation

## 2013-08-25 DIAGNOSIS — I70269 Atherosclerosis of native arteries of extremities with gangrene, unspecified extremity: Secondary | ICD-10-CM

## 2013-08-25 DIAGNOSIS — I672 Cerebral atherosclerosis: Secondary | ICD-10-CM | POA: Insufficient documentation

## 2013-08-25 DIAGNOSIS — K2100 Gastro-esophageal reflux disease with esophagitis, without bleeding: Secondary | ICD-10-CM | POA: Insufficient documentation

## 2013-08-25 DIAGNOSIS — M9080 Osteopathy in diseases classified elsewhere, unspecified site: Secondary | ICD-10-CM | POA: Insufficient documentation

## 2013-08-25 DIAGNOSIS — F411 Generalized anxiety disorder: Secondary | ICD-10-CM | POA: Diagnosis present

## 2013-08-25 DIAGNOSIS — Z79899 Other long term (current) drug therapy: Secondary | ICD-10-CM | POA: Insufficient documentation

## 2013-08-25 DIAGNOSIS — F0150 Vascular dementia without behavioral disturbance: Secondary | ICD-10-CM | POA: Diagnosis present

## 2013-08-25 DIAGNOSIS — K921 Melena: Secondary | ICD-10-CM | POA: Diagnosis present

## 2013-08-25 DIAGNOSIS — I96 Gangrene, not elsewhere classified: Secondary | ICD-10-CM | POA: Insufficient documentation

## 2013-08-25 DIAGNOSIS — Z8673 Personal history of transient ischemic attack (TIA), and cerebral infarction without residual deficits: Secondary | ICD-10-CM

## 2013-08-25 DIAGNOSIS — E119 Type 2 diabetes mellitus without complications: Secondary | ICD-10-CM

## 2013-08-25 DIAGNOSIS — D62 Acute posthemorrhagic anemia: Secondary | ICD-10-CM | POA: Diagnosis present

## 2013-08-25 DIAGNOSIS — H409 Unspecified glaucoma: Secondary | ICD-10-CM | POA: Insufficient documentation

## 2013-08-25 DIAGNOSIS — N390 Urinary tract infection, site not specified: Secondary | ICD-10-CM | POA: Diagnosis present

## 2013-08-25 DIAGNOSIS — E871 Hypo-osmolality and hyponatremia: Secondary | ICD-10-CM | POA: Diagnosis present

## 2013-08-25 DIAGNOSIS — Z01812 Encounter for preprocedural laboratory examination: Secondary | ICD-10-CM | POA: Insufficient documentation

## 2013-08-25 DIAGNOSIS — Z01810 Encounter for preprocedural cardiovascular examination: Secondary | ICD-10-CM | POA: Insufficient documentation

## 2013-08-25 DIAGNOSIS — E1169 Type 2 diabetes mellitus with other specified complication: Principal | ICD-10-CM | POA: Diagnosis present

## 2013-08-25 DIAGNOSIS — K589 Irritable bowel syndrome without diarrhea: Secondary | ICD-10-CM | POA: Insufficient documentation

## 2013-08-25 DIAGNOSIS — Z66 Do not resuscitate: Secondary | ICD-10-CM | POA: Diagnosis present

## 2013-08-25 DIAGNOSIS — G9341 Metabolic encephalopathy: Secondary | ICD-10-CM | POA: Diagnosis not present

## 2013-08-25 DIAGNOSIS — R404 Transient alteration of awareness: Secondary | ICD-10-CM | POA: Diagnosis not present

## 2013-08-25 HISTORY — PX: AMPUTATION: SHX166

## 2013-08-25 LAB — GLUCOSE, CAPILLARY: Glucose-Capillary: 91 mg/dL (ref 70–99)

## 2013-08-25 LAB — POCT I-STAT 4, (NA,K, GLUC, HGB,HCT): Sodium: 137 mEq/L (ref 135–145)

## 2013-08-25 SURGERY — AMPUTATION DIGIT
Anesthesia: General | Site: Toe | Laterality: Right | Wound class: Clean

## 2013-08-25 MED ORDER — PROPOFOL 10 MG/ML IV BOLUS
INTRAVENOUS | Status: DC | PRN
  Administered 2013-08-25: 100 mg via INTRAVENOUS
  Administered 2013-08-25: 70 mg via INTRAVENOUS

## 2013-08-25 MED ORDER — MEPERIDINE HCL 25 MG/ML IJ SOLN: 6.2500 mg | INTRAMUSCULAR | Status: DC | PRN

## 2013-08-25 MED ORDER — LACTATED RINGERS IV SOLN
INTRAVENOUS | Status: DC
  Administered 2013-08-25: 10:00:00 via INTRAVENOUS

## 2013-08-25 MED ORDER — OXYCODONE HCL 5 MG PO TABS: 5.0000 mg | ORAL_TABLET | Freq: Once | ORAL | Status: DC | PRN

## 2013-08-25 MED ORDER — HYDROMORPHONE HCL PF 1 MG/ML IJ SOLN: 0.2500 mg | INTRAMUSCULAR | Status: DC | PRN

## 2013-08-25 MED ORDER — ONDANSETRON HCL 4 MG/2ML IJ SOLN: 4.0000 mg | Freq: Once | INTRAMUSCULAR | Status: DC | PRN

## 2013-08-25 MED ORDER — 0.9 % SODIUM CHLORIDE (POUR BTL) OPTIME
TOPICAL | Status: DC | PRN
  Administered 2013-08-25: 1000 mL

## 2013-08-25 MED ORDER — LIDOCAINE HCL (CARDIAC) 20 MG/ML IV SOLN
INTRAVENOUS | Status: DC | PRN
  Administered 2013-08-25: 100 mg via INTRAVENOUS

## 2013-08-25 MED ORDER — OXYCODONE HCL 5 MG/5ML PO SOLN: 5.0000 mg | Freq: Once | ORAL | Status: DC | PRN

## 2013-08-25 MED ORDER — FENTANYL CITRATE 0.05 MG/ML IJ SOLN
INTRAMUSCULAR | Status: DC | PRN
  Administered 2013-08-25: 50 ug via INTRAVENOUS

## 2013-08-25 MED ORDER — SODIUM CHLORIDE 0.9 % IV SOLN: INTRAVENOUS | Status: DC

## 2013-08-25 MED ORDER — TRAMADOL HCL 50 MG PO TABS: 50.0000 mg | ORAL_TABLET | Freq: Three times a day (TID) | ORAL | Status: DC | PRN

## 2013-08-25 SURGICAL SUPPLY — 31 items
BANDAGE ELASTIC 4 VELCRO ST LF (GAUZE/BANDAGES/DRESSINGS) ×2 IMPLANT
BANDAGE GAUZE ELAST BULKY 4 IN (GAUZE/BANDAGES/DRESSINGS) ×2 IMPLANT
CANISTER SUCTION 2500CC (MISCELLANEOUS) ×2 IMPLANT
COVER SURGICAL LIGHT HANDLE (MISCELLANEOUS) ×2 IMPLANT
DRAPE EXTREMITY T 121X128X90 (DRAPE) ×2 IMPLANT
DRSG EMULSION OIL 3X3 NADH (GAUZE/BANDAGES/DRESSINGS) ×2 IMPLANT
ELECT REM PT RETURN 9FT ADLT (ELECTROSURGICAL) ×2
ELECTRODE REM PT RTRN 9FT ADLT (ELECTROSURGICAL) ×1 IMPLANT
GLOVE BIO SURGEON STRL SZ7.5 (GLOVE) ×4 IMPLANT
GLOVE BIOGEL PI IND STRL 7.0 (GLOVE) ×1 IMPLANT
GLOVE BIOGEL PI IND STRL 8 (GLOVE) ×1 IMPLANT
GLOVE BIOGEL PI INDICATOR 7.0 (GLOVE) ×1
GLOVE BIOGEL PI INDICATOR 8 (GLOVE) ×1
GOWN PREVENTION PLUS XLARGE (GOWN DISPOSABLE) ×2 IMPLANT
GOWN STRL NON-REIN LRG LVL3 (GOWN DISPOSABLE) ×6 IMPLANT
KIT BASIN OR (CUSTOM PROCEDURE TRAY) ×2 IMPLANT
KIT ROOM TURNOVER OR (KITS) ×2 IMPLANT
NS IRRIG 1000ML POUR BTL (IV SOLUTION) ×2 IMPLANT
PACK GENERAL/GYN (CUSTOM PROCEDURE TRAY) ×2 IMPLANT
PAD ARMBOARD 7.5X6 YLW CONV (MISCELLANEOUS) ×4 IMPLANT
SPECIMEN JAR SMALL (MISCELLANEOUS) ×2 IMPLANT
SPONGE GAUZE 4X4 12PLY (GAUZE/BANDAGES/DRESSINGS) ×2 IMPLANT
SUT ETHILON 3 0 PS 1 (SUTURE) ×2 IMPLANT
SUT VIC AB 3-0 SH 27 (SUTURE)
SUT VIC AB 3-0 SH 27X BRD (SUTURE) IMPLANT
SWAB COLLECTION DEVICE MRSA (MISCELLANEOUS) IMPLANT
TOWEL OR 17X24 6PK STRL BLUE (TOWEL DISPOSABLE) ×2 IMPLANT
TOWEL OR 17X26 10 PK STRL BLUE (TOWEL DISPOSABLE) ×2 IMPLANT
TUBE ANAEROBIC SPECIMEN COL (MISCELLANEOUS) IMPLANT
UNDERPAD 30X30 INCONTINENT (UNDERPADS AND DIAPERS) ×2 IMPLANT
WATER STERILE IRR 1000ML POUR (IV SOLUTION) ×2 IMPLANT

## 2013-08-25 NOTE — Anesthesia Preprocedure Evaluation (Addendum)
Anesthesia Evaluation  Patient identified by MRN, date of birth, ID band Patient awake    Reviewed: Allergy & Precautions, H&P , NPO status , Patient's Chart, lab work & pertinent test results  Airway Mallampati: I TM Distance: >3 FB Neck ROM: Full    Dental  (+) Dental Advisory Given   Pulmonary          Cardiovascular hypertension, Pt. on medications + Peripheral Vascular Disease     Neuro/Psych    GI/Hepatic GERD-  Medicated and Controlled,  Endo/Other  diabetes, Type 2, Insulin Dependent  Renal/GU      Musculoskeletal   Abdominal   Peds  Hematology   Anesthesia Other Findings   Reproductive/Obstetrics                          Anesthesia Physical Anesthesia Plan  ASA: II  Anesthesia Plan: General   Post-op Pain Management:    Induction: Intravenous  Airway Management Planned: LMA  Additional Equipment:   Intra-op Plan:   Post-operative Plan: Extubation in OR  Informed Consent: I have reviewed the patients History and Physical, chart, labs and discussed the procedure including the risks, benefits and alternatives for the proposed anesthesia with the patient or authorized representative who has indicated his/her understanding and acceptance.     Plan Discussed with: CRNA and Surgeon  Anesthesia Plan Comments:         Anesthesia Quick Evaluation

## 2013-08-25 NOTE — Preoperative (Signed)
Beta Blockers   Reason not to administer Beta Blockers:Not Applicable 

## 2013-08-25 NOTE — H&P (View-Only) (Signed)
[facility] [place] HISTORY AND PHYSICAL   History of Present Illness:  Patient is a 79 y.o. year old female who presents for evaluation of painful gangrenous right second toe.  The patient is a poor historian. She is demented. There is no family at the office visit today. She is with [facility] caregiver [facility] who also does not know much of her history. The patient states she does have pain in the right second toe.  Other medical problems include anemia, diabetes, glaucoma, bronchitis, dementia, hypertension all of which are currently stable  Past Medical History  Diagnosis Date  . Fall   . FTT (failure to thrive) in adult   . Anemia   . Diabetes mellitus without complication   . Anxiety   . Glaucoma   . Acute bronchitis 11/22/2011  . Unspecified vitamin D deficiency 08/30/2011  . Vascular dementia, uncomplicated 02/26/2011  . Unspecified late effects of cerebrovascular disease 11/18/2008  . Reflux esophagitis 11/18/2008  . Ulcer of other part of foot 11/18/2008  . Shortness of breath 11/18/2008  . Paralysis agitans 11/03/2008  . Unspecified essential hypertension 11/03/2008  . Irritable bowel syndrome 11/03/2008  . Ulcer     right second toe    No past surgical history on file.  Social History History  Substance Use Topics  . Smoking status: Never Smoker   . Smokeless tobacco: Never Used  . Alcohol Use: No    Family History No family history on file.  Allergies  Allergies  Allergen Reactions  . Fish Allergy   . Iodine      Current Outpatient Prescriptions  Medication Sig Dispense Refill  . acetaminophen (TYLENOL) 500 MG tablet Take 1,000 mg by mouth 2 (two) times daily.      . amLODipine (NORVASC) 5 MG tablet Take 5 mg by mouth daily.      . aspirin 81 MG tablet Take 81 mg by mouth daily.      . carbidopa-levodopa (SINEMET IR) 25-100 MG per tablet Take 1 tablet by mouth every 8 (eight) hours. Take 1/2 tab      . cholecalciferol  (VITAMIN D) 1000 UNITS tablet Take 2,000 Units by mouth daily.      . cloNIDine (CATAPRES) 0.1 MG tablet Take 0.1 mg by mouth daily as needed (sbp >180).      . dorzolamide-timolol (COSOPT) 22.3-6.8 MG/ML ophthalmic solution Place 1 drop into both eyes 2 (two) times daily.      . escitalopram (LEXAPRO) 10 MG tablet Take 10 mg by mouth daily.      . food thickener (THICK IT) POWD Take 1 Container by mouth as needed (for honey thick liquids).      . insulin aspart (NOVOLOG) 100 UNIT/ML injection Inject 5 Units into the skin 3 (three) times daily with meals. For cbg >=150 prior to meals      . insulin detemir (LEVEMIR) 100 UNIT/ML injection Inject 8 Units into the skin at bedtime.       . magnesium hydroxide (MILK OF MAGNESIA) 400 MG/5ML suspension Take 30 mLs by mouth daily as needed for constipation.      . Memantine HCl ER (NAMENDA XR) 28 MG CP24 Take 1 capsule by mouth daily.      . Multiple Vitamin (MULTIVITAMIN) tablet Take 1 tablet by mouth daily.       No current facility-administered medications for this visit.    ROS:   Unable to obtain review of systems due to patient's dementia.    Physical Examination  Filed Vitals:   08/12/13 1313  BP: 169/53  Pulse: 83  Resp: 16    General:  Answers questions alert and oriented to person  HEENT: Normal Pulmonary: Clear to auscultation bilaterally Cardiac: Regular Rate and Rhythm  Abdomen: Soft, non-tender, non-distended Skin: No rash, gangrene right second toe extending to the base of the toe, no erythema Extremity Pulses:  2+ radial, brachial, femoral, absent dorsalis pedis, posterior tibial pulses bilaterally Musculoskeletal: No deformity or edema  Neurologic: Upper and lower extremity motor 5/5 and symmetric  DATA:  Right ABI and arterial duplex report from mobile X. is reviewed today this was performed on October 1. Duplex shows evidence of distal tibial disease with an ABI of 0.66. Images are not available for  review   ASSESSMENT:  Gangrene right second toe and a demented severely disabled patient who is wheelchair-bound. I discussed the situation with the patient's daughter Patricia Walton today. I believe the patient needs any dictation of her right second toe. However, there is probably a 50% chance that this will not heal. If the toe amputation does not heal she would be faced with a right above-knee amputation. This was discussed at length the patient's daughter today. Her daughter is going out of the country today. She wishes to schedule the procedure for October 22. Risks benefits possible complications and procedure details were explained to the daughter today. She will be scheduled for toe amputation October 22.   PLAN:  See above  Charles Fields, MD [facility] [place] Office: [phone] Pager: [phone]   

## 2013-08-25 NOTE — Interval H&P Note (Signed)
History and Physical Interval Note:  08/25/2013 10:40 AM  [name]  has presented today for surgery, with the diagnosis of gangrene of right 2nd toe  The various methods of treatment have been discussed with the patient and family. After consideration of risks, benefits and other options for treatment, the patient has consented to  Procedure(s): AMPUTATION OF RIGHT 2ND TOE (Right) as a surgical intervention .  The patient's history has been reviewed, patient examined, no change in status, stable for surgery.  I have reviewed the patient's chart and labs.  Questions were answered to the patient's satisfaction.     FIELDS,CHARLES E

## 2013-08-25 NOTE — Op Note (Signed)
Procedure: Amputation right second toe with resection of metatarsal head  Preoperative diagnosis: Osteomyelitis right second toe  Postoperative diagnosis: Same  Anesthesia: Gen.  Specimens: Right second toe  Assistant: Nurse  Operative details: After obtaining informed consent, the patient was taken to the operating room. The patient was placed in supine position on the operating room table. After induction of general anesthesia the patient's entire right foot was prepped and draped in usual sterile fashion. A circumferential incision was made at the base of the right second toe. The incision was carried all the way down to the level of the bone. There was some bleeding from the skin edge. The bone was transected with a bone cutter in the midportion of the proximal phalanx. The remainder of the proximal phalanx was debrided away with rongeurs. The metatarsal head was removed with rongeurs. The wound was thoroughly irrigated with normal saline solution. Hemostasis was obtained. Skin edges were reapproximated using interrupted 3-0 vertical mattress and simple nylon sutures. A dry sterile dressing was applied. The patient tolerated procedure well and there were no complications. Instrument sponge and needle counts were correct at the end of the case. The patient was taken to recovery in stable condition.   Fabienne Bruns, MD  [facility] [place]  Office: [phone]  Pager: [phone]

## 2013-08-25 NOTE — Telephone Encounter (Addendum)
Message copied by Fredrich Birks on Wed Aug 25, 2013  1:51 PM ------      Message from: Fabienne Bruns E      Created: Wed Aug 25, 2013 12:05 PM       Needs follow up appt in 1 month            Charles ------  08/25/13: lm for pt on home #, dpm

## 2013-08-25 NOTE — Anesthesia Procedure Notes (Signed)
Procedure Name: LMA Insertion Date/Time: 08/25/2013 11:27 AM Performed by: [name] Pre-anesthesia Checklist: Patient identified, Timeout performed, Emergency Drugs available, Suction available and Patient being monitored Patient Re-evaluated:Patient Re-evaluated prior to inductionPreoxygenation: Pre-oxygenation with 100% oxygen Intubation Type: IV induction LMA: LMA inserted and LMA with gastric port inserted LMA Size: 4.0 Tube type: Oral Tube size: 4.0 (20 cc air) mm Number of attempts: 1 Placement Confirmation: positive ETCO2 and breath sounds checked- equal and bilateral Tube secured with: Tape Dental Injury: Teeth and Oropharynx as per pre-operative assessment  Comments: IV induction Ossey- LMA am crna atraumatic

## 2013-08-25 NOTE — Anesthesia Postprocedure Evaluation (Signed)
Anesthesia Post Note  Patient: [name]  Procedure(s) Performed: Procedure(s) (LRB): AMPUTATION OF RIGHT 2ND TOE (Right)  Anesthesia type: general  Patient location: PACU  Post pain: Pain level controlled  Post assessment: Patient's Cardiovascular Status Stable  Last Vitals:  Filed Vitals:   08/25/13 1300  BP:   Pulse: 83  Temp:   Resp: 7    Post vital signs: Reviewed and stable  Level of consciousness: sedated  Complications: No apparent anesthesia complications

## 2013-08-25 NOTE — Transfer of Care (Signed)
Immediate Anesthesia Transfer of Care Note  Patient: [name]  Procedure(s) Performed: Procedure(s): AMPUTATION OF RIGHT 2ND TOE (Right)  Patient Location: PACU  Anesthesia Type:General  Level of Consciousness: sedated  Airway & Oxygen Therapy: Patient Spontanous Breathing and Patient connected to nasal cannula oxygen  Post-op Assessment: Report given to PACU RN and Post -op Vital signs reviewed and stable  Post vital signs: Reviewed and stable  Complications: No apparent anesthesia complications

## 2013-08-26 ENCOUNTER — Encounter (HOSPITAL_COMMUNITY): Payer: Self-pay | Admitting: Vascular Surgery

## 2013-08-27 ENCOUNTER — Encounter (HOSPITAL_COMMUNITY): Payer: Self-pay

## 2013-08-27 ENCOUNTER — Inpatient Hospital Stay (HOSPITAL_COMMUNITY)
Admission: EM | Admit: 2013-08-27 | Discharge: 2013-08-30 | DRG: 616 | Disposition: A | Discharge status: Skilled Nursing Facility | Payer: Medicare Other | Attending: Internal Medicine | Admitting: Internal Medicine

## 2013-08-27 ENCOUNTER — Emergency Department (HOSPITAL_COMMUNITY): Payer: Medicare Other

## 2013-08-27 DIAGNOSIS — I872 Venous insufficiency (chronic) (peripheral): Secondary | ICD-10-CM

## 2013-08-27 DIAGNOSIS — E860 Dehydration: Secondary | ICD-10-CM

## 2013-08-27 DIAGNOSIS — D62 Acute posthemorrhagic anemia: Secondary | ICD-10-CM

## 2013-08-27 DIAGNOSIS — R410 Disorientation, unspecified: Secondary | ICD-10-CM

## 2013-08-27 DIAGNOSIS — M545 Low back pain, unspecified: Secondary | ICD-10-CM

## 2013-08-27 DIAGNOSIS — F411 Generalized anxiety disorder: Secondary | ICD-10-CM

## 2013-08-27 DIAGNOSIS — R1310 Dysphagia, unspecified: Secondary | ICD-10-CM

## 2013-08-27 DIAGNOSIS — H409 Unspecified glaucoma: Secondary | ICD-10-CM

## 2013-08-27 DIAGNOSIS — G20A1 Parkinson's disease without dyskinesia, without mention of fluctuations: Secondary | ICD-10-CM

## 2013-08-27 DIAGNOSIS — E871 Hypo-osmolality and hyponatremia: Secondary | ICD-10-CM

## 2013-08-27 DIAGNOSIS — K219 Gastro-esophageal reflux disease without esophagitis: Secondary | ICD-10-CM

## 2013-08-27 DIAGNOSIS — E669 Obesity, unspecified: Secondary | ICD-10-CM

## 2013-08-27 DIAGNOSIS — F0150 Vascular dementia without behavioral disturbance: Secondary | ICD-10-CM | POA: Diagnosis present

## 2013-08-27 DIAGNOSIS — K589 Irritable bowel syndrome without diarrhea: Secondary | ICD-10-CM

## 2013-08-27 DIAGNOSIS — I70209 Unspecified atherosclerosis of native arteries of extremities, unspecified extremity: Secondary | ICD-10-CM

## 2013-08-27 DIAGNOSIS — R404 Transient alteration of awareness: Secondary | ICD-10-CM | POA: Diagnosis present

## 2013-08-27 DIAGNOSIS — G20 Parkinson's disease: Secondary | ICD-10-CM

## 2013-08-27 DIAGNOSIS — E1169 Type 2 diabetes mellitus with other specified complication: Principal | ICD-10-CM

## 2013-08-27 DIAGNOSIS — N390 Urinary tract infection, site not specified: Secondary | ICD-10-CM

## 2013-08-27 DIAGNOSIS — E162 Hypoglycemia, unspecified: Secondary | ICD-10-CM

## 2013-08-27 DIAGNOSIS — M869 Osteomyelitis, unspecified: Secondary | ICD-10-CM | POA: Diagnosis present

## 2013-08-27 DIAGNOSIS — L98499 Non-pressure chronic ulcer of skin of other sites with unspecified severity: Secondary | ICD-10-CM

## 2013-08-27 DIAGNOSIS — E1159 Type 2 diabetes mellitus with other circulatory complications: Secondary | ICD-10-CM

## 2013-08-27 DIAGNOSIS — G9341 Metabolic encephalopathy: Secondary | ICD-10-CM

## 2013-08-27 DIAGNOSIS — I10 Essential (primary) hypertension: Secondary | ICD-10-CM

## 2013-08-27 DIAGNOSIS — E559 Vitamin D deficiency, unspecified: Secondary | ICD-10-CM

## 2013-08-27 DIAGNOSIS — M1990 Unspecified osteoarthritis, unspecified site: Secondary | ICD-10-CM

## 2013-08-27 DIAGNOSIS — K922 Gastrointestinal hemorrhage, unspecified: Secondary | ICD-10-CM

## 2013-08-27 DIAGNOSIS — E119 Type 2 diabetes mellitus without complications: Secondary | ICD-10-CM

## 2013-08-27 DIAGNOSIS — M9080 Osteopathy in diseases classified elsewhere, unspecified site: Secondary | ICD-10-CM | POA: Diagnosis present

## 2013-08-27 LAB — GLUCOSE, CAPILLARY
Glucose-Capillary: 51 mg/dL — ABNORMAL LOW (ref 70–99)
Glucose-Capillary: 95 mg/dL (ref 70–99)
Glucose-Capillary: 136 mg/dL — ABNORMAL HIGH (ref 70–99)
Glucose-Capillary: 126 mg/dL — ABNORMAL HIGH (ref 70–99)
Glucose-Capillary: 171 mg/dL — ABNORMAL HIGH (ref 70–99)

## 2013-08-27 LAB — BASIC METABOLIC PANEL
CO2: 26 mEq/L (ref 19–32)
Calcium: 8.5 mg/dL (ref 8.4–10.5)
Chloride: 96 mEq/L (ref 96–112)
Creatinine, Ser: 0.65 mg/dL (ref 0.50–1.10)
GFR calc Af Amer: 89 mL/min — ABNORMAL LOW (ref >90)
GFR calc non Af Amer: 76 mL/min — ABNORMAL LOW (ref >90)
Sodium: 130 mEq/L — ABNORMAL LOW (ref 135–145)

## 2013-08-27 LAB — CBC WITH DIFFERENTIAL/PLATELET
Basophils Absolute: 0 10*3/uL (ref 0.0–0.1)
Eosinophils Absolute: 0 10*3/uL (ref 0.0–0.7)
Eosinophils Relative: 0 % (ref 0–5)
HCT: 25.4 % — ABNORMAL LOW (ref 36.0–46.0)
Lymphocytes Relative: 9 % — ABNORMAL LOW (ref 12–46)
MCH: 29.9 pg (ref 26.0–34.0)
MCHC: 32.7 g/dL (ref 30.0–36.0)
MCV: 91.4 fL (ref 78.0–100.0)
Monocytes Absolute: 0.9 10*3/uL (ref 0.1–1.0)
Platelets: 266 10*3/uL (ref 150–400)
RDW: 13 % (ref 11.5–15.5)
WBC: 10.8 10*3/uL — ABNORMAL HIGH (ref 4.0–10.5)

## 2013-08-27 LAB — OCCULT BLOOD, POC DEVICE: Fecal Occult Bld: POSITIVE — AB

## 2013-08-27 LAB — URINALYSIS, ROUTINE W REFLEX MICROSCOPIC
Bilirubin Urine: NEGATIVE
Glucose, UA: NEGATIVE mg/dL
Ketones, ur: NEGATIVE mg/dL
Protein, ur: NEGATIVE mg/dL
Specific Gravity, Urine: 1.021 (ref 1.005–1.030)
Urobilinogen, UA: 0.2 mg/dL (ref 0.0–1.0)

## 2013-08-27 LAB — URINE MICROSCOPIC-ADD ON

## 2013-08-27 LAB — TYPE AND SCREEN: ABO/RH(D): O POS

## 2013-08-27 MED ORDER — ACETAMINOPHEN 325 MG PO TABS: 650.0000 mg | ORAL_TABLET | Freq: Four times a day (QID) | ORAL | Status: DC | PRN

## 2013-08-27 MED ORDER — STARCH (THICKENING) PO POWD
1.0000 | ORAL | Status: DC | PRN
  Filled 2013-08-27: qty 227

## 2013-08-27 MED ORDER — AMLODIPINE BESYLATE 5 MG PO TABS
5.0000 mg | ORAL_TABLET | Freq: Every day | ORAL | Status: DC
  Administered 2013-08-28 – 2013-08-30 (×3): 5 mg via ORAL
  Filled 2013-08-27 (×3): qty 1

## 2013-08-27 MED ORDER — CLONIDINE HCL 0.1 MG PO TABS: 0.1000 mg | ORAL_TABLET | Freq: Every day | ORAL | Status: DC | PRN

## 2013-08-27 MED ORDER — SODIUM CHLORIDE 0.9 % IV SOLN
INTRAVENOUS | Status: DC
  Administered 2013-08-27 – 2013-08-30 (×4): via INTRAVENOUS

## 2013-08-27 MED ORDER — MEMANTINE HCL ER 28 MG PO CP24: 1.0000 | ORAL_CAPSULE | Freq: Every day | ORAL | Status: DC

## 2013-08-27 MED ORDER — ALUM & MAG HYDROXIDE-SIMETH 200-200-20 MG/5ML PO SUSP
30.0000 mL | Freq: Four times a day (QID) | ORAL | Status: DC | PRN
  Filled 2013-08-27: qty 30

## 2013-08-27 MED ORDER — OXYCODONE HCL 5 MG PO TABS
5.0000 mg | ORAL_TABLET | ORAL | Status: DC | PRN
  Administered 2013-08-29: 5 mg via ORAL
  Filled 2013-08-27: qty 1

## 2013-08-27 MED ORDER — DEXTROSE 5 % IV SOLN
1.0000 g | INTRAVENOUS | Status: DC
  Administered 2013-08-28 – 2013-08-29 (×2): 1 g via INTRAVENOUS
  Filled 2013-08-27 (×3): qty 10

## 2013-08-27 MED ORDER — ACETAMINOPHEN 650 MG RE SUPP: 650.0000 mg | Freq: Four times a day (QID) | RECTAL | Status: DC | PRN

## 2013-08-27 MED ORDER — DEXTROSE 5 % IV SOLN
1.0000 g | Freq: Once | INTRAVENOUS | Status: AC
  Administered 2013-08-27: 1 g via INTRAVENOUS
  Filled 2013-08-27: qty 10

## 2013-08-27 MED ORDER — SODIUM CHLORIDE 0.9 % IV BOLUS (SEPSIS)
500.0000 mL | Freq: Once | INTRAVENOUS | Status: AC
  Administered 2013-08-27: 500 mL via INTRAVENOUS

## 2013-08-27 MED ORDER — INSULIN ASPART 100 UNIT/ML ~~LOC~~ SOLN: 5.0000 [IU] | Freq: Three times a day (TID) | SUBCUTANEOUS | Status: DC

## 2013-08-27 MED ORDER — INSULIN ASPART 100 UNIT/ML ~~LOC~~ SOLN
0.0000 [IU] | SUBCUTANEOUS | Status: DC
  Administered 2013-08-27: 2 [IU] via SUBCUTANEOUS
  Administered 2013-08-28: 3 [IU] via SUBCUTANEOUS
  Administered 2013-08-29: 2 [IU] via SUBCUTANEOUS
  Administered 2013-08-29: 3 [IU] via SUBCUTANEOUS
  Administered 2013-08-30 (×2): 1 [IU] via SUBCUTANEOUS
  Administered 2013-08-30: 2 [IU] via SUBCUTANEOUS
  Administered 2013-08-30: 1 [IU] via SUBCUTANEOUS

## 2013-08-27 MED ORDER — SODIUM CHLORIDE 0.9 % IV SOLN
INTRAVENOUS | Status: AC
  Administered 2013-08-27: 20:00:00 via INTRAVENOUS

## 2013-08-27 MED ORDER — RESOURCE THICKENUP CLEAR PO POWD
ORAL | Status: DC | PRN
  Filled 2013-08-27: qty 125

## 2013-08-27 MED ORDER — DORZOLAMIDE HCL-TIMOLOL MAL 2-0.5 % OP SOLN
1.0000 [drp] | Freq: Two times a day (BID) | OPHTHALMIC | Status: DC
  Administered 2013-08-28 – 2013-08-30 (×5): 1 [drp] via OPHTHALMIC
  Filled 2013-08-27: qty 10

## 2013-08-27 MED ORDER — PANTOPRAZOLE SODIUM 40 MG IV SOLR
40.0000 mg | Freq: Two times a day (BID) | INTRAVENOUS | Status: DC
  Administered 2013-08-27 – 2013-08-30 (×6): 40 mg via INTRAVENOUS
  Filled 2013-08-27 (×8): qty 40

## 2013-08-27 MED ORDER — CHOLECALCIFEROL 50 MCG (2000 UT) PO CAPS: 1.0000 | ORAL_CAPSULE | Freq: Every day | ORAL | Status: DC

## 2013-08-27 MED ORDER — ONDANSETRON HCL 4 MG/2ML IJ SOLN: 4.0000 mg | Freq: Four times a day (QID) | INTRAMUSCULAR | Status: DC | PRN

## 2013-08-27 MED ORDER — CARBIDOPA-LEVODOPA 25-100 MG PO TABS
0.5000 | ORAL_TABLET | Freq: Three times a day (TID) | ORAL | Status: DC
  Administered 2013-08-28 – 2013-08-30 (×6): 0.5 via ORAL
  Filled 2013-08-27 (×11): qty 0.5

## 2013-08-27 MED ORDER — DEXTROSE 50 % IV SOLN
25.0000 mL | Freq: Once | INTRAVENOUS | Status: AC
  Administered 2013-08-27: 25 mL via INTRAVENOUS
  Filled 2013-08-27: qty 50

## 2013-08-27 MED ORDER — ESCITALOPRAM OXALATE 10 MG PO TABS
10.0000 mg | ORAL_TABLET | Freq: Every day | ORAL | Status: DC
  Administered 2013-08-28 – 2013-08-30 (×3): 10 mg via ORAL
  Filled 2013-08-27 (×3): qty 1

## 2013-08-27 MED ORDER — VITAMIN D3 25 MCG (1000 UNIT) PO TABS
2000.0000 [IU] | ORAL_TABLET | Freq: Every day | ORAL | Status: DC
  Administered 2013-08-28 – 2013-08-30 (×3): 2000 [IU] via ORAL
  Filled 2013-08-27 (×3): qty 2

## 2013-08-27 MED ORDER — MEMANTINE HCL 10 MG PO TABS
10.0000 mg | ORAL_TABLET | Freq: Two times a day (BID) | ORAL | Status: DC
  Administered 2013-08-28 – 2013-08-30 (×5): 10 mg via ORAL
  Filled 2013-08-27 (×7): qty 1

## 2013-08-27 MED ORDER — ONDANSETRON HCL 4 MG PO TABS
4.0000 mg | ORAL_TABLET | Freq: Four times a day (QID) | ORAL | Status: DC | PRN
  Administered 2013-08-29: 4 mg via ORAL
  Filled 2013-08-27: qty 1

## 2013-08-27 MED ORDER — HYDROMORPHONE HCL PF 1 MG/ML IJ SOLN: 0.5000 mg | INTRAMUSCULAR | Status: DC | PRN

## 2013-08-27 NOTE — ED Provider Notes (Signed)
CSN: [id number]     Arrival date & time 08/27/13  1405 History   First MD Initiated Contact with Patient 08/27/13 1503     Chief Complaint  Patient presents with  . Hypoglycemia   (Consider location/radiation/quality/duration/timing/severity/associated sxs/prior Treatment) HPI Comments: 79 yo female with Parkinson's and lives in NH, DM, HTN, Dementia presents with confusion and low glucose.  Since right toe amputation from DM she has not been as alert and decreased po intake.  Pt was confused on ems arrival and sugar 33.  Pt has had intermittent low glu in the past with confusion.  Pt was given dextrose and clinically improved and repeat glu 200s.  No fevers or other sxs reported.  Intermittent sxs.  No stroke hx or focal findings. Mental status gradually worsening for 1 mo per daughter.   Patient is a 79 y.o. female presenting with hypoglycemia. The history is provided by the patient.  Hypoglycemia Initial blood sugar:  33   Past Medical History  Diagnosis Date  . Fall   . FTT (failure to thrive) in adult   . Anemia   . Diabetes mellitus without complication   . Anxiety   . Glaucoma   . Acute bronchitis 11/22/2011  . Unspecified vitamin D deficiency 08/30/2011  . Vascular dementia, uncomplicated 02/26/2011  . Unspecified late effects of cerebrovascular disease 11/18/2008  . Reflux esophagitis 11/18/2008  . Ulcer of other part of foot 11/18/2008  . Shortness of breath 11/18/2008  . Paralysis agitans 11/03/2008  . Unspecified essential hypertension 11/03/2008  . Irritable bowel syndrome 11/03/2008  . Ulcer     right second toe   Past Surgical History  Procedure Laterality Date  . Amputation Right 08/25/2013    Procedure: AMPUTATION OF RIGHT 2ND TOE;  Surgeon: Sherren Kerns, MD;  Location: [facility] OR;  Service: Vascular;  Laterality: Right;   No family history on file. History  Substance Use Topics  . Smoking status: Never Smoker   . Smokeless tobacco: Never Used  . Alcohol  Use: No   OB History   Grav Para Term Preterm Abortions TAB SAB Ect Mult Living                 Review of Systems  Unable to perform ROS: Dementia    Allergies  Fish allergy; Iodine; and Shellfish allergy  Home Medications   Current Outpatient Rx  Name  Route  Sig  Dispense  Refill  . acetaminophen (TYLENOL) 500 MG tablet   Oral   Take 1,000 mg by mouth 2 (two) times daily.         Marland Kitchen amLODipine (NORVASC) 5 MG tablet   Oral   Take 5 mg by mouth daily.         Marland Kitchen aspirin 81 MG tablet   Oral   Take 81 mg by mouth daily.         . carbidopa-levodopa (SINEMET IR) 25-100 MG per tablet   Oral   Take 0.5 tablets by mouth every 8 (eight) hours.          . Cholecalciferol 2000 UNITS CAPS   Oral   Take 1 capsule by mouth daily.         . cloNIDine (CATAPRES) 0.1 MG tablet   Oral   Take 0.1 mg by mouth daily as needed (sbp >180).         . dorzolamide-timolol (COSOPT) 22.3-6.8 MG/ML ophthalmic solution   Both Eyes   Place 1 drop into both eyes  2 (two) times daily.         Marland Kitchen escitalopram (LEXAPRO) 10 MG tablet   Oral   Take 10 mg by mouth daily.         . insulin aspart (NOVOLOG) 100 UNIT/ML injection   Subcutaneous   Inject 5 Units into the skin 3 (three) times daily with meals. For cbg >=150 prior to meals         . insulin detemir (LEVEMIR) 100 UNIT/ML injection   Subcutaneous   Inject 10 Units into the skin at bedtime.          . magnesium hydroxide (MILK OF MAGNESIA) 400 MG/5ML suspension   Oral   Take 30 mLs by mouth daily as needed for constipation.         . Memantine HCl ER (NAMENDA XR) 28 MG CP24   Oral   Take 1 capsule by mouth daily.         . Multiple Vitamin (MULTIVITAMIN) tablet   Oral   Take 1 tablet by mouth daily.         . traMADol (ULTRAM) 50 MG tablet   Oral   Take 1 tablet (50 mg total) by mouth every 8 (eight) hours as needed for pain.   20 tablet   0   . food thickener (THICK IT) POWD   Oral   Take 1  Container by mouth as needed (for honey thick liquids).          BP 109/59  Pulse 74  Temp(Src) 96.5 F (35.8 C) (Oral)  Resp 16  SpO2 99% Physical Exam  Nursing note and vitals reviewed. Constitutional: She is oriented to person, place, and time. She appears well-developed and well-nourished. No distress.  HENT:  Head: Normocephalic and atraumatic.  Dry mm  Eyes: Conjunctivae and EOM are normal. Right eye exhibits no discharge. Left eye exhibits no discharge.  Neck: Normal range of motion. Neck supple. No tracheal deviation present.  Cardiovascular: Normal rate and regular rhythm.   Pulmonary/Chest: Effort normal and breath sounds normal.  Abdominal: Soft. She exhibits no distension. There is no tenderness. There is no guarding.  Musculoskeletal: She exhibits no edema and no tenderness.  Neurological: She is alert and oriented to person, place, and time. GCS eye subscore is 4. GCS verbal subscore is 5. GCS motor subscore is 6.  Pleasant dementia No facial droop or arm drift Moves all ext equal with mild weakness Follows commands. Mild sleepy  Skin: Skin is warm. No rash noted.  2nd toe right post amputation, mild dried blood, no warmth or streaking, no edema  Psychiatric: She has a normal mood and affect.    ED Course  Procedures (including critical care time) Labs Review Labs Reviewed  GLUCOSE, CAPILLARY - Abnormal; Notable for the following:    Glucose-Capillary 51 (*)    All other components within normal limits  URINALYSIS, ROUTINE W REFLEX MICROSCOPIC - Abnormal; Notable for the following:    APPearance TURBID (*)    Hgb urine dipstick LARGE (*)    Leukocytes, UA LARGE (*)    All other components within normal limits  CBC WITH DIFFERENTIAL - Abnormal; Notable for the following:    WBC 10.8 (*)    RBC 2.78 (*)    Hemoglobin 8.3 (*)    HCT 25.4 (*)    Neutrophils Relative % 83 (*)    Neutro Abs 8.9 (*)    Lymphocytes Relative 9 (*)    All other components  within normal limits  BASIC METABOLIC PANEL - Abnormal; Notable for the following:    Sodium 130 (*)    Glucose, Bld 311 (*)    GFR calc non Af Amer 76 (*)    GFR calc Af Amer 89 (*)    All other components within normal limits  GLUCOSE, CAPILLARY - Abnormal; Notable for the following:    Glucose-Capillary 136 (*)    All other components within normal limits  URINE MICROSCOPIC-ADD ON - Abnormal; Notable for the following:    Bacteria, UA MANY (*)    All other components within normal limits  OCCULT BLOOD, POC DEVICE - Abnormal; Notable for the following:    Fecal Occult Bld POSITIVE (*)    All other components within normal limits  URINE CULTURE  GLUCOSE, CAPILLARY  TYPE AND SCREEN   Imaging Review Dg Chest Portable 1 View  08/27/2013   CLINICAL DATA:  Hypoglycemia, unresponsive. Hypertension, diabetes  EXAM: PORTABLE CHEST - 1 VIEW  COMPARISON:  01/19/2011  FINDINGS: The heart size and mediastinal contours are within normal limits. Both lungs are clear. The visualized skeletal structures are unremarkable.  IMPRESSION: No active disease.   Electronically Signed   By: Charlett Nose M.D.   On: 08/27/2013 16:21    EKG Interpretation   None       MDM  No diagnosis found. No changes in DM meds, pt will need to decrease until her diet improves since surgery. Dehydration clinically- IV fluids given. Hypoglycemia- IV dext and po softs given. Discussed CT head with daughter and with age/ dementia even if there is a stroke it would not change management, daughter okay with no CT at this time.  UA and CXR to look for infection from recent surgery.  Rocephin in ED.  L fluids given.   Rechecked, vitals stable, updated family. The patients results and plan were reviewed and discussed.   Any x-rays performed were personally reviewed by myself.   Differential diagnosis were considered with the presenting HPI.    Admission/ observation were discussed with the admitting physician,  patient and/or family and they are comfortable with the plan.  Diagnosis:  Acute blood loss anemia, Hypoglycemia, GI bleed, Dehydration, Hyponatremia, UTI, confusion,   Enid Skeens, MD 08/27/13 1958

## 2013-08-27 NOTE — ED Notes (Signed)
Pt here from [facility], for hypoglycemia, staff at [facility] stated she didn't wake up as normal and since recent toe surgery she has not been eating as well.  cbg was 33 and given D 10 and now pt is back at baseline, repeat cbg 218, 20 right forearm, 110/38,84,100%

## 2013-08-27 NOTE — ED Notes (Signed)
Family at bedside. 

## 2013-08-27 NOTE — ED Notes (Signed)
CBG 95 

## 2013-08-27 NOTE — H&P (Signed)
[facility] History and Physical  Gricel Copen UXL:[id number] DOB: 17-Dec-1921 DOA: 08/27/2013  Referring physician:  EDP PCP: Bufford Spikes, DO  Specialists:   Chief Complaint: Decreased Responsiveness  HPI: Fronia Depass is a 79 y.o. female resident of the [facility] who was sent to the ED due to decreased responsiveness over the past 2 days.    She underwent an amputation of her Right 2nd Toe on 10/22.  Her daughter reports that since the surgery she has been lethargic  And has had decreased responsiveness, and has had decreased intake of foods and liquids.    In the ED she was evaluated and was found to have a glucose level of 33  And was treated  With IV Dextrose.  She was found to have a decrease in her Hemoglobin from 11.2 to 8.3 over the past 2 days as well, and a FOBT was performed and found to be HEME+.    Also a UA was positive, and she was placed on IV Rocephin and referred for admission.       Review of Systems: Unable to Obtain from the Patient  Past Medical History  Diagnosis Date  . Fall   . FTT (failure to thrive) in adult   . Anemia   . Diabetes mellitus without complication   . Anxiety   . Glaucoma   . Acute bronchitis 11/22/2011  . Unspecified vitamin D deficiency 08/30/2011  . Vascular dementia, uncomplicated 02/26/2011  . Unspecified late effects of cerebrovascular disease 11/18/2008  . Reflux esophagitis 11/18/2008  . Ulcer of other part of foot 11/18/2008  . Shortness of breath 11/18/2008  . Paralysis agitans 11/03/2008  . Unspecified essential hypertension 11/03/2008  . Irritable bowel syndrome 11/03/2008  . Ulcer     right second toe    Past Surgical History  Procedure Laterality Date  . Amputation Right 08/25/2013    Procedure: AMPUTATION OF RIGHT 2ND TOE;  Surgeon: Sherren Kerns, MD;  [facility]: [facility];  Service: Vascular;  Laterality: Right;    Prior to Admission medications   Medication Sig Start Date End Date Taking?  Authorizing Provider  acetaminophen (TYLENOL) 500 MG tablet Take 1,000 mg by mouth 2 (two) times daily.   Yes Historical Provider, MD  amLODipine (NORVASC) 5 MG tablet Take 5 mg by mouth daily.   Yes Historical Provider, MD  aspirin 81 MG tablet Take 81 mg by mouth daily.   Yes Historical Provider, MD  carbidopa-levodopa (SINEMET IR) 25-100 MG per tablet Take 0.5 tablets by mouth every 8 (eight) hours.    Yes Historical Provider, MD  Cholecalciferol 2000 UNITS CAPS Take 1 capsule by mouth daily.   Yes Historical Provider, MD  cloNIDine (CATAPRES) 0.1 MG tablet Take 0.1 mg by mouth daily as needed (sbp >180).   Yes Historical Provider, MD  dorzolamide-timolol (COSOPT) 22.3-6.8 MG/ML ophthalmic solution Place 1 drop into both eyes 2 (two) times daily.   Yes Historical Provider, MD  escitalopram (LEXAPRO) 10 MG tablet Take 10 mg by mouth daily.   Yes Historical Provider, MD  insulin aspart (NOVOLOG) 100 UNIT/ML injection Inject 5 Units into the skin 3 (three) times daily with meals. For cbg >=150 prior to meals   Yes Historical Provider, MD  insulin detemir (LEVEMIR) 100 UNIT/ML injection Inject 10 Units into the skin at bedtime.    Yes Historical Provider, MD  magnesium hydroxide (MILK OF MAGNESIA) 400 MG/5ML suspension Take 30 mLs by mouth daily as needed for constipation.  Yes Historical Provider, MD  Memantine HCl ER (NAMENDA XR) 28 MG CP24 Take 1 capsule by mouth daily.   Yes Historical Provider, MD  Multiple Vitamin (MULTIVITAMIN) tablet Take 1 tablet by mouth daily.   Yes Historical Provider, MD  traMADol (ULTRAM) 50 MG tablet Take 1 tablet (50 mg total) by mouth every 8 (eight) hours as needed for pain. 08/25/13  Yes Sherren Kerns, MD  food thickener (THICK IT) POWD Take 1 Container by mouth as needed (for honey thick liquids).    Historical Provider, MD    Allergies  Allergen Reactions  . Fish Allergy Other (See Comments)    Reaction unspecified on MAR  . Iodine Other (See Comments)     Reaction unspecified on MAR  . Shellfish Allergy Other (See Comments)    Reaction unspecified on MAR    Social History:  Resident of the [employer].       reports that she has never smoked. She has never used smokeless tobacco. She reports that she does not drink alcohol or use illicit drugs.     Family History  Problem Relation Age of Onset  . Stroke Mother   . Diabetes Mother   . Diabetes Sister      x 2  . Hypertension Mother   . Lung cancer Father      Physical Exam:  GEN:  Elderly Obtunded Obese  79 y.o. African American female  examined  and in no acute distress; cooperative with exam Filed Vitals:   08/27/13 1900 08/27/13 1915 08/27/13 2000 08/27/13 2047  BP: 125/48 144/55 103/78 143/55  Pulse: 80 89 85 102  Temp:    97.6 F (36.4 C)  TempSrc:    Oral  Resp:      Weight:    67.314 kg (148 lb 6.4 oz)  SpO2: 100% 100% 100% 95%   Blood pressure 143/55, pulse 102, temperature 97.6 F (36.4 C), temperature source Oral, resp. rate 16, weight 67.314 kg (148 lb 6.4 oz), SpO2 95.00%. PSYCH: She is Obtunded;   HEENT: Normocephalic and Atraumatic, Mucous membranes pink; PERRLA; EOM intact; Fundi:  Benign;  No scleral icterus, Nares: Patent, Oropharynx: Clear, Edentulous, Neck:  FROM, no cervical lymphadenopathy nor thyromegaly or carotid bruit; no JVD; Breasts:: Not examined CHEST WALL: No tenderness CHEST: Normal respiration, clear to auscultation bilaterally HEART: Regular rate and rhythm; no murmurs rubs or gallops BACK: No kyphosis or scoliosis; no CVA tenderness ABDOMEN: Positive Bowel Sounds, Scaphoid, Obese, soft non-tender; no masses, no organomegaly, no pannus; no intertriginous candida. Rectal Exam: Not done EXTREMITIES: Noo cyanosis, clubbing or edema; no ulcerations. Genitalia: not examined PULSES: 2+ and symmetric SKIN: Normal hydration no rash,    + Surgical amputation Wound oif the Right 2nd Toe to the MTP area sutures present, clean base No  signs of Infection.    CNS: Cranial nerves 2-12 grossly intact no focal neurologic deficit    Labs on Admission:  Basic Metabolic Panel:  Recent Labs Lab 08/25/13 0907 08/27/13 1533  NA 137 130*  K 4.0 3.8  CL  --  96  CO2  --  26  GLUCOSE 103* 311*  BUN  --  13  CREATININE  --  0.65  CALCIUM  --  8.5   Liver Function Tests: No results found for this basename: AST, ALT, ALKPHOS, BILITOT, PROT, ALBUMIN,  in the last 168 hours No results found for this basename: LIPASE, AMYLASE,  in the last 168 hours No results found for  this basename: AMMONIA,  in the last 168 hours CBC:  Recent Labs Lab 08/25/13 0907 08/27/13 1533  WBC  --  10.8*  NEUTROABS  --  8.9*  HGB 11.2* 8.3*  HCT 33.0* 25.4*  MCV  --  91.4  PLT  --  266   Cardiac Enzymes: No results found for this basename: CKTOTAL, CKMB, CKMBINDEX, TROPONINI,  in the last 168 hours  BNP (last 3 results) No results found for this basename: PROBNP,  in the last 8760 hours CBG:  Recent Labs Lab 08/27/13 1436 08/27/13 1516 08/27/13 1637 08/27/13 2003 08/27/13 2043  GLUCAP 95 51* 136* 126* 171*    Radiological Exams on Admission: Dg Chest Portable 1 View  08/27/2013   CLINICAL DATA:  Hypoglycemia, unresponsive. Hypertension, diabetes  EXAM: PORTABLE CHEST - 1 VIEW  COMPARISON:  01/19/2011  FINDINGS: The heart size and mediastinal contours are within normal limits. Both lungs are clear. The visualized skeletal structures are unremarkable.  IMPRESSION: No active disease.   Electronically Signed   By: Charlett Nose M.D.   On: 08/27/2013 16:21       Assessment/Plan Principal Problem:   Metabolic encephalopathy Active Problems:   Acute blood loss anemia   DIABETES MELLITUS   HYPERTENSION   Vascular dementia   Hypoglycemia   Dehydration   GI bleed   UTI (lower urinary tract infection)   Hyponatremia     1.   Metabolic Encephalopathy- Multifactorial- due to Hypoglycemia,and UTI.  Placed on Rx for Both .  monitor mental status.    2.   UTI- Urine C+S sent, placed on IV Rocephin.    3.   Acute Blood Loss Anemia- from Gi Bleed, (FOBT=HEME+)-  Monitor H/Hs transfuse if needed.   IV Protonix ordered.     Family does not wish to pursue a GI workup.    4.   GI Bleed-  See #3.    5.  Hypoglycemia- Glucose checks q 1 hour x 4 then q 4 hours,  Hold Insulin for now, and SSI coverage PRN Elevated Glucose levels.     6.   Dehydration-  IVFs to rehydrate.    7.   Vascular Dementia- on   Namenda Rx, continue when alert to take PO.    8.  DVT prophylaxis with SCDs.          Code Status:    DO NOT RESUSCITATE Family Communication:    Daughter by Telephone Disposition Plan:   Inpatient Time spent:   Ron Parker [facility] Pager [phone]  If 7PM-7AM, please contact night-coverage [email or web address] Password [email or web address] 08/27/2013, 11:57 PM

## 2013-08-27 NOTE — ED Notes (Signed)
Patient is still responding and answering questions appropriately.

## 2013-08-28 DIAGNOSIS — D62 Acute posthemorrhagic anemia: Secondary | ICD-10-CM | POA: Diagnosis present

## 2013-08-28 DIAGNOSIS — K922 Gastrointestinal hemorrhage, unspecified: Secondary | ICD-10-CM | POA: Diagnosis present

## 2013-08-28 DIAGNOSIS — G9341 Metabolic encephalopathy: Secondary | ICD-10-CM | POA: Diagnosis present

## 2013-08-28 DIAGNOSIS — E860 Dehydration: Secondary | ICD-10-CM | POA: Diagnosis present

## 2013-08-28 DIAGNOSIS — N390 Urinary tract infection, site not specified: Secondary | ICD-10-CM | POA: Diagnosis present

## 2013-08-28 DIAGNOSIS — E162 Hypoglycemia, unspecified: Secondary | ICD-10-CM | POA: Diagnosis present

## 2013-08-28 DIAGNOSIS — E871 Hypo-osmolality and hyponatremia: Secondary | ICD-10-CM | POA: Diagnosis present

## 2013-08-28 LAB — GLUCOSE, CAPILLARY
Glucose-Capillary: 100 mg/dL — ABNORMAL HIGH (ref 70–99)
Glucose-Capillary: 75 mg/dL (ref 70–99)
Glucose-Capillary: 221 mg/dL — ABNORMAL HIGH (ref 70–99)

## 2013-08-28 LAB — BASIC METABOLIC PANEL
BUN: 10 mg/dL (ref 6–23)
CO2: 26 mEq/L (ref 19–32)
Calcium: 8.8 mg/dL (ref 8.4–10.5)
Chloride: 100 mEq/L (ref 96–112)
Creatinine, Ser: 0.55 mg/dL (ref 0.50–1.10)
Glucose, Bld: 98 mg/dL (ref 70–99)

## 2013-08-28 LAB — CBC
HCT: 27.6 % — ABNORMAL LOW (ref 36.0–46.0)
MCH: 30.4 pg (ref 26.0–34.0)
MCHC: 33 g/dL (ref 30.0–36.0)
MCV: 92.3 fL (ref 78.0–100.0)
Platelets: 279 10*3/uL (ref 150–400)
RDW: 12.9 % (ref 11.5–15.5)
WBC: 9.5 10*3/uL (ref 4.0–10.5)

## 2013-08-28 LAB — HEMOGLOBIN AND HEMATOCRIT, BLOOD
HCT: 26.4 % — ABNORMAL LOW (ref 36.0–46.0)
HCT: 26.7 % — ABNORMAL LOW (ref 36.0–46.0)
Hemoglobin: 8.7 g/dL — ABNORMAL LOW (ref 12.0–15.0)

## 2013-08-28 NOTE — Progress Notes (Signed)
[facility]                                                                                Patient Demographics  [name], is a 79 y.o. female, DOB - [birth year], ZOX:[id number]  Admit date - 08/27/2013   Admitting Physician Ron Parker, MD  Outpatient Primary MD for the patient is [name], [name], DO  LOS - 1   Chief Complaint  Patient presents with  . Hypoglycemia        Assessment & Plan    Principal Problem:   Metabolic encephalopathy Active Problems:   DIABETES MELLITUS   HYPERTENSION   Vascular dementia   Hypoglycemia   Acute blood loss anemia   Dehydration   GI bleed   UTI (lower urinary tract infection)   Hyponatremia  Metabolic Encephalopathy -Secondary to multifactorial causes: UTI, hypoglycemia.   -Will continue to montior.  -Daughter at bedside and states patient is at her baseline.   Urinary Tract Infection -Pending urine culture.   -Will continue IV ceftriaxone. -Patient is afebrile with no leukocytosis.  Acute Blood Loss Anemia-  -Likely GIBleed as patient was occult positive.  -Will continue to monitor H/H and IV protonix. -Discussed with daughter possible colonscopy and GI consult.  She stated that at this time she does not feel her mother can handle the procedure and declined.  Hypoglycemia Continue ISS with CBG.    Dehydration Continue IV fluids.  Vascular Dementia Continue Namenda.  Code Status: DNR  Family Communication: Daughter at bedside.  Disposition Plan: Admitted   Procedures None  Consults  None  DVT Prophylaxis SCDs   Lab Results  Component Value Date   PLT 279 08/28/2013    Medications  Scheduled Meds: . sodium chloride   Intravenous STAT  . amLODipine  5 mg Oral Daily  . carbidopa-levodopa  0.5 tablet Oral Q8H  . cefTRIAXone (ROCEPHIN)  IV  1 g Intravenous Q24H  . cholecalciferol  2,000 Units Oral Daily  . dorzolamide-timolol  1 drop Both Eyes BID  . escitalopram  10 mg Oral Daily  .  insulin aspart  0-9 Units Subcutaneous Q4H  . insulin aspart  5 Units Subcutaneous TID WC  . memantine  10 mg Oral BID  . pantoprazole (PROTONIX) IV  40 mg Intravenous Q12H   Continuous Infusions: . sodium chloride 75 mL/hr at 08/27/13 2045   PRN Meds:.acetaminophen, acetaminophen, alum & mag hydroxide-simeth, cloNIDine, HYDROmorphone (DILAUDID) injection, ondansetron (ZOFRAN) IV, ondansetron, oxyCODONE, RESOURCE THICKENUP CLEAR  Antibiotics    Anti-infectives   Start     Dose/Rate Route Frequency Ordered Stop   08/28/13 1800  cefTRIAXone (ROCEPHIN) 1 g in dextrose 5 % 50 mL IVPB     1 g 100 mL/hr over 30 Minutes Intravenous Every 24 hours 08/27/13 2013     08/27/13 1900  cefTRIAXone (ROCEPHIN) 1 g in dextrose 5 % 50 mL IVPB     1 g 100 mL/hr over 30 Minutes Intravenous  Once 08/27/13 1857 08/27/13 1959       Time Spent in minutes   35 minutes   [name] D.O. on 08/28/2013 at 7:39 AM  Between 7am to 7pm - Pager -  [phone]  After 7pm go to [email or web address] - password [email or web address]  And look for the night coverage person covering for me after hours  [facility] Office  [phone]    Subjective:   [name] seen and examined today. Patient awake, however, unable to answer questions due to her mental status.   Objective:   Filed Vitals:   08/27/13 1915 08/27/13 2000 08/27/13 2047 08/28/13 0450  BP: 144/55 103/78 143/55 118/61  Pulse: 89 85 102 80  Temp:   97.6 F (36.4 C) 98.7 F (37.1 C)  TempSrc:   Oral Oral  Resp:    18  Weight:   67.314 kg (148 lb 6.4 oz)   SpO2: 100% 100% 95% 96%    Wt Readings from Last 3 Encounters:  08/27/13 67.314 kg (148 lb 6.4 oz)  08/25/13 70.761 kg (156 lb)  08/25/13 70.761 kg (156 lb)     Intake/Output Summary (Last 24 hours) at 08/28/13 0739 Last data filed at 08/27/13 2004  Gross per 24 hour  Intake   1000 ml  Output      0 ml  Net   1000 ml    Exam  General: Well developed, well nourished,  NAD, appears stated age  HEENT: NCAT, PERRLA, EOMI, Anicteic Sclera, mucous membranes moist. No pharyngeal erythema or exudates  Neck: Supple, no JVD, no masses,  Cardiovascular: S1 S2 auscultated, no rubs, murmurs or gallops.  No ventricular heave. Regular rate and rhythm.  Respiratory: Clear to auscultation bilaterally with equal chest rise  Abdomen: Soft, obese, non-tender to palpation, + bowel sounds  Extremities: warm dry without cyanosis clubbing or edema.  Amputation of right 2nd toe to the MTP, sutures present, clean.  No signs of erythema or infection.  Neuro: Awake, unable to answer questions.  Skin: Without rashes exudates or nodules  Psych: Unable to assess.  Data Review   Micro Results No results found for this or any previous visit (from the past 240 hour(s)).  Radiology Reports Dg Chest Portable 1 View  08/27/2013   CLINICAL DATA:  Hypoglycemia, unresponsive. Hypertension, diabetes  EXAM: PORTABLE CHEST - 1 VIEW  COMPARISON:  01/19/2011  FINDINGS: The heart size and mediastinal contours are within normal limits. Both lungs are clear. The visualized skeletal structures are unremarkable.  IMPRESSION: No active disease.   Electronically Signed   By: Charlett Nose M.D.   On: 08/27/2013 16:21    CBC  Recent Labs Lab 08/25/13 0907 08/27/13 1533 08/28/13 0526  WBC  --  10.8* 9.5  HGB 11.2* 8.3* 9.1*  HCT 33.0* 25.4* 27.6*  PLT  --  266 279  MCV  --  91.4 92.3  MCH  --  29.9 30.4  MCHC  --  32.7 33.0  RDW  --  13.0 12.9  LYMPHSABS  --  0.9  --   MONOABS  --  0.9  --   EOSABS  --  0.0  --   BASOSABS  --  0.0  --     Chemistries   Recent Labs Lab 08/25/13 0907 08/27/13 1533 08/28/13 0526  NA 137 130* 135  K 4.0 3.8 4.0  CL  --  96 100  CO2  --  26 26  GLUCOSE 103* 311* 98  BUN  --  13 10  CREATININE  --  0.65 0.55  CALCIUM  --  8.5 8.8    ------------------------------------------------------------------------------------------------------------------ CrCl is unknown because both a height and weight (above a minimum accepted value) are required  for this calculation. ------------------------------------------------------------------------------------------------------------------ No results found for this basename: HGBA1C,  in the last 72 hours ------------------------------------------------------------------------------------------------------------------ No results found for this basename: CHOL, HDL, LDLCALC, TRIG, CHOLHDL, LDLDIRECT,  in the last 72 hours ------------------------------------------------------------------------------------------------------------------ No results found for this basename: TSH, T4TOTAL, FREET3, T3FREE, THYROIDAB,  in the last 72 hours ------------------------------------------------------------------------------------------------------------------ No results found for this basename: VITAMINB12, FOLATE, FERRITIN, TIBC, IRON, RETICCTPCT,  in the last 72 hours  Coagulation profile No results found for this basename: INR, PROTIME,  in the last 168 hours  No results found for this basename: DDIMER,  in the last 72 hours  Cardiac Enzymes No results found for this basename: CK, CKMB, TROPONINI, MYOGLOBIN,  in the last 168 hours ------------------------------------------------------------------------------------------------------------------ No components found with this basename: POCBNP,

## 2013-08-28 NOTE — Progress Notes (Signed)
Pt passed bedside swallowing evaluation.  Pt was able to swallow apple sauce with no problems.  MD notified.  Pt ordered pureed diet.

## 2013-08-29 DIAGNOSIS — F29 Unspecified psychosis not due to a substance or known physiological condition: Secondary | ICD-10-CM

## 2013-08-29 DIAGNOSIS — I798 Other disorders of arteries, arterioles and capillaries in diseases classified elsewhere: Secondary | ICD-10-CM

## 2013-08-29 DIAGNOSIS — E1159 Type 2 diabetes mellitus with other circulatory complications: Secondary | ICD-10-CM

## 2013-08-29 LAB — CBC
Hemoglobin: 8.4 g/dL — ABNORMAL LOW (ref 12.0–15.0)
MCH: 29.4 pg (ref 26.0–34.0)
MCHC: 32.1 g/dL (ref 30.0–36.0)
Platelets: 273 10*3/uL (ref 150–400)
RBC: 2.86 MIL/uL — ABNORMAL LOW (ref 3.87–5.11)
RDW: 12.9 % (ref 11.5–15.5)
WBC: 8.8 10*3/uL (ref 4.0–10.5)

## 2013-08-29 LAB — GLUCOSE, CAPILLARY
Glucose-Capillary: 181 mg/dL — ABNORMAL HIGH (ref 70–99)
Glucose-Capillary: 212 mg/dL — ABNORMAL HIGH (ref 70–99)

## 2013-08-29 LAB — URINE CULTURE: Culture: NO GROWTH

## 2013-08-29 MED ORDER — CEFUROXIME AXETIL 250 MG PO TABS: 250.0000 mg | ORAL_TABLET | Freq: Two times a day (BID) | ORAL | Status: DC

## 2013-08-29 MED ORDER — ALUM & MAG HYDROXIDE-SIMETH 200-200-20 MG/5ML PO SUSP: 30.0000 mL | Freq: Four times a day (QID) | ORAL | Status: DC | PRN

## 2013-08-29 MED ORDER — ENSURE PUDDING PO PUDG
1.0000 | ORAL | Status: DC
  Administered 2013-08-29: 1 via ORAL

## 2013-08-29 MED ORDER — GLUCERNA SHAKE PO LIQD
237.0000 mL | Freq: Two times a day (BID) | ORAL | Status: DC
  Administered 2013-08-30: 237 mL via ORAL

## 2013-08-29 NOTE — Progress Notes (Signed)
Pt refused breakfast and lunch today, but we were able to get her to eat a few bites of each.  Pt is more lethargic today and less interactive.  She has had some slight abd pain that was relieved by oxycodone.  Pt is now resting comfortably with daughter at bedside.

## 2013-08-29 NOTE — Progress Notes (Signed)
I spoke with pt's daughter this afternoon and daughter says that drowsiness began on Wednesday at her surgery for her amputation.  She said they had difficulty waking her mother out of anesthesia and since then she has not been out of bed and says he "bones are tired".  She states pt baseline is typically awake and alert throughout the day, but she is sometimes confused and difficult to understand even prior to this episode.

## 2013-08-29 NOTE — Discharge Summary (Addendum)
Physician Discharge Summary  [name] NFA:[id number] DOB: [birth year] DOA: 08/27/2013  PCP: Bufford Spikes, DO  Admit date: 08/27/2013 Discharge date: 08/29/2013  Time spent: 35 minutes  Recommendations for Outpatient Follow-up:  Patient should follow with the physician at the nursing home within 2 days of arrival. She should continue taking medications as prescribed. She should also follow up with her primary care physician within one week of discharge. It is discussed with the patient's daughter regarding possible colonoscopy and endoscopy, however patient's daughter declined intervention at this time. Patient's hemoglobin and hematocrit should continue being monitored. Her aspirin was held due to recurrent GI bleed. Physician at the nursing home or primary care physician should address this.   Discharge Diagnoses:  Principal Problem:   Metabolic encephalopathy secondary to multifactorial causes including UTI and hyperglycemia. Active Problems:   DIABETES MELLITUS   HYPERTENSION   Vascular dementia   Hypoglycemia   Acute blood loss anemia, likely secondary to GI bleed.   Dehydration   GI bleed   UTI (lower urinary tract infection)   Hyponatremia   Discharge Condition: Stable  Diet recommendation: Heart healthy  Filed Weights   08/27/13 2047 08/28/13 2014  Weight: 67.314 kg (148 lb 6.4 oz) 67.314 kg (148 lb 6.4 oz)    History of present illness:  [name] is a 79 y.o. female resident of the [facility] who was sent to the ED due to decreased responsiveness over the past 2 days. She underwent an amputation of her Right 2nd Toe on 10/22. Her daughter reports that since the surgery she has been lethargic And has had decreased responsiveness, and has had decreased intake of foods and liquids. In the ED she was evaluated and was found to have a glucose level of 33 And was treated With IV Dextrose. She was found to have a decrease in her Hemoglobin from 11.2 to 8.3  over the past 2 days as well, and a FOBT was performed and found to be HEME+. Also a UA was positive, and she was placed on IV Rocephin and referred for admission.    Hospital Course:  This is an 79 year old patient with a history of vascular dementia, anemia, , diabetes or presented to the emergency department for decreased responsiveness. Upon talking to her daughter, patient has been this way for weeks. However over the last couple of days her confusion has worsened. Patient had become more lethargic. She also had a decreased intake of both foods and liquids. In the emergency room upon arrival patient was found to have a blood sugar of 33. She was treated with IV dextrose. Patient was then placed on sliding insulin scale along with continued blood glucose monitoring. Her glucose levels have remained stable. Patient was also noted to have a decrease in her hemoglobin from 11.2 to approximately 8. Her fecal occult was found to be heme positive. Possible EGD and colonoscopy were discussed with the patient's daughter however she felt that there was not suitable for her mother at this time. She did decline both procedures. Patient's hemoglobin and hematocrit were continued to be monitored every 8 hours. Her hemoglobin did remain stable at 8.4.   Her aspirin therapy was held due to to current bleeding. This is something that should be addressed by the nursing home physician or primary care physician. Patient's hemoglobin and hematocrit should also continue to be followed. Urinalysis was also conducted showing that she did have a urinary tract infection. Patient was admitted and started on IV Rocephin.  Urine cultures are still pending. Patient will be discharged back to the nursing home with by mouth Ceftin.  Procedures: None  Consultations: None  Discharge Exam: Filed Vitals:   08/29/13 0648  BP: 123/52  Pulse: 78  Temp: 97.8 F (36.6 C)  Resp:     Exam  General: Well developed, well nourished,  NAD, appears stated age  HEENT: NCAT, PERRLA, EOMI, Anicteic Sclera, mucous membranes moist. No pharyngeal erythema or exudates  Neck: Supple, no JVD, no masses,  Cardiovascular: S1 S2 auscultated, no rubs, murmurs or gallops. No ventricular heave. Regular rate and rhythm.  Respiratory: Clear to auscultation bilaterally with equal chest rise  Abdomen: Soft, obese, non-tender to palpation, + bowel sounds  Extremities: warm dry without cyanosis clubbing or edema. Amputation of right 2nd toe to the MTP, sutures present, clean. No signs of erythema or infection.  Neuro: Awake, unable to answer questions.  Skin: Without rashes exudates or nodules  Psych: Unable to assess  Discharge Instructions  Discharge Orders   Future Appointments Provider Department Dept Phone   09/23/2013 1:30 PM Sherren Kerns, MD [facility] [phone]   Future Orders Complete By Expires   Diet - low sodium heart healthy  As directed    Discharge instructions  As directed    Comments:     Patient should follow with the physician at the nursing home within 2 days of arrival. She should continue taking medications as prescribed. She should also follow up with her primary care physician within one week of discharge. It is discussed with the patient's daughter regarding possible colonoscopy and endoscopy, however patient's daughter declined intervention at this time. Patient's hemoglobin and hematocrit should continue being monitored. Her aspirin was held due to recurrent GI bleed. Physician at the nursing home or primary care physician should address this.   Increase activity slowly  As directed        Medication List    STOP taking these medications       aspirin 81 MG tablet      TAKE these medications       acetaminophen 500 MG tablet  Commonly known as:  TYLENOL  Take 1,000 mg by mouth 2 (two) times daily.     alum & mag hydroxide-simeth 200-200-20 MG/5ML suspension   Commonly known as:  MAALOX/MYLANTA  Take 30 mLs by mouth every 6 (six) hours as needed.     amLODipine 5 MG tablet  Commonly known as:  NORVASC  Take 5 mg by mouth daily.     carbidopa-levodopa 25-100 MG per tablet  Commonly known as:  SINEMET IR  Take 0.5 tablets by mouth every 8 (eight) hours.     cefUROXime 250 MG tablet  Commonly known as:  CEFTIN  Take 1 tablet (250 mg total) by mouth 2 (two) times daily.     Cholecalciferol 2000 UNITS Caps  Take 1 capsule by mouth daily.     cloNIDine 0.1 MG tablet  Commonly known as:  CATAPRES  Take 0.1 mg by mouth daily as needed (sbp >180).     dorzolamide-timolol 22.3-6.8 MG/ML ophthalmic solution  Commonly known as:  COSOPT  Place 1 drop into both eyes 2 (two) times daily.     escitalopram 10 MG tablet  Commonly known as:  LEXAPRO  Take 10 mg by mouth daily.     food thickener Powd  Commonly known as:  THICK IT  Take 1 Container by mouth as needed (for honey thick  liquids).     insulin aspart 100 UNIT/ML injection  Commonly known as:  novoLOG  Inject 5 Units into the skin 3 (three) times daily with meals. For cbg >=150 prior to meals     insulin detemir 100 UNIT/ML injection  Commonly known as:  LEVEMIR  Inject 10 Units into the skin at bedtime.     magnesium hydroxide 400 MG/5ML suspension  Commonly known as:  MILK OF MAGNESIA  Take 30 mLs by mouth daily as needed for constipation.     multivitamin tablet  Take 1 tablet by mouth daily.     NAMENDA XR 28 MG Cp24  Generic drug:  Memantine HCl ER  Take 1 capsule by mouth daily.     traMADol 50 MG tablet  Commonly known as:  ULTRAM  Take 1 tablet (50 mg total) by mouth every 8 (eight) hours as needed for pain.       Allergies  Allergen Reactions  . Fish Allergy Other (See Comments)    Reaction unspecified on MAR  . Iodine Other (See Comments)    Reaction unspecified on MAR  . Shellfish Allergy Other (See Comments)    Reaction unspecified on MAR        Follow-up Information   Follow up with [name], [name], DO. Schedule an appointment as soon as possible for a visit in 1 week.   Specialty:  Geriatric Medicine   Contact information:   [address] [place] [address] [phone]       Follow up with Physician at the nursing home In 2 days.       The results of significant diagnostics from this hospitalization (including imaging, microbiology, ancillary and laboratory) are listed below for reference.    Significant Diagnostic Studies: Dg Chest Portable 1 View  08/27/2013   CLINICAL DATA:  Hypoglycemia, unresponsive. Hypertension, diabetes  EXAM: PORTABLE CHEST - 1 VIEW  COMPARISON:  01/19/2011  FINDINGS: The heart size and mediastinal contours are within normal limits. Both lungs are clear. The visualized skeletal structures are unremarkable.  IMPRESSION: No active disease.   Electronically Signed   By: Charlett Nose M.D.   On: 08/27/2013 16:21    Microbiology: Recent Results (from the past 240 hour(s))  URINE CULTURE     Status: None   Collection Time    08/27/13  5:47 PM      Result Value Range Status   Specimen Description URINE, CATHETERIZED   Final   Special Requests NONE   Final   Culture  Setup Time     Final   Value: 08/28/2013 03:26     Performed at [facility]   Colony Count     Final   Value: NO GROWTH     Performed at [facility]   Culture     Final   Value: NO GROWTH     Performed at [facility]   Report Status 08/29/2013 FINAL   Final     Labs: Basic Metabolic Panel:  Recent Labs Lab 08/25/13 0907 08/27/13 1533 08/28/13 0526  NA 137 130* 135  K 4.0 3.8 4.0  CL  --  96 100  CO2  --  26 26  GLUCOSE 103* 311* 98  BUN  --  13 10  CREATININE  --  0.65 0.55  CALCIUM  --  8.5 8.8   Liver Function Tests: No results found for this basename: AST, ALT, ALKPHOS, BILITOT, PROT, ALBUMIN,  in the last 168 hours  No results found for this basename: LIPASE, AMYLASE,  in the last  168 hours No results found for this basename: AMMONIA,  in the last 168 hours CBC:  Recent Labs Lab 08/27/13 1533 08/28/13 0526 08/28/13 1356 08/28/13 1955 08/29/13 0555  WBC 10.8* 9.5  --   --  8.8  NEUTROABS 8.9*  --   --   --   --   HGB 8.3* 9.1* 8.7* 8.7* 8.4*  HCT 25.4* 27.6* 26.4* 26.7* 26.2*  MCV 91.4 92.3  --   --  91.6  PLT 266 279  --   --  273   Cardiac Enzymes: No results found for this basename: CKTOTAL, CKMB, CKMBINDEX, TROPONINI,  in the last 168 hours BNP: BNP (last 3 results) No results found for this basename: PROBNP,  in the last 8760 hours CBG:  Recent Labs Lab 08/28/13 1621 08/28/13 1955 08/29/13 0011 08/29/13 0431 08/29/13 0748  GLUCAP 171* 221* 181* 79 212*       Signed:  Zyrion Coey  [facility] 08/29/2013, 10:44 AM  Patient will be discharged to nursing home today. She was seen examined today. Patient remained stable. No change from yesterday. Patient should follow up with the physician at the nursing home in a few days. She is currently being treated for urinary tract infection. She should continue by mouth antibiotics as prescribed.

## 2013-08-29 NOTE — Clinical Social Work Psychosocial (Signed)
Clinical Social Work Department BRIEF PSYCHOSOCIAL ASSESSMENT 08/29/2013  Patient:  Bradway,Kristin     Account Number:  [id number]     Admit date:  08/27/2013  Clinical Social Worker:  ,  Date/Time:  08/29/2013 11:18 AM  Referred by:  Physician  Date Referred:  08/29/2013 Referred for  SNF Placement   Other Referral:   Interview type:  Family Other interview type:   SPOKE WITH DAUGHTER PAT    PSYCHOSOCIAL DATA Living Status:  FACILITY Admitted from facility:  [facility], [place] Level of care:  Skilled Nursing Facility Primary support name:  Pat Primary support relationship to patient:  FAMILY Degree of support available:   good    CURRENT CONCERNS Current Concerns  Post-Acute Placement   Other Concerns:    SOCIAL WORK ASSESSMENT / PLAN patient is ready for d/c back to SNF bed at [facility]- confirmed they can accept her back today and will arrange EMS per family request.   Assessment/plan status:  No Further Intervention Required Other assessment/ plan:   Information/referral to community resources:   EMS    PATIENT'S/FAMILY'S RESPONSE TO PLAN OF CARE: Family agreeable to d/c back today to SNF bed at [facility]. EMS to transport.       Reece Levy, MSW, LCSWA [phone] coverage

## 2013-08-29 NOTE — Progress Notes (Signed)
[facility]                                                                                Patient Demographics  [name], is a 79 y.o. female, DOB - 1922/09/17, WUJ:[id number]  Admit date - 08/27/2013   Admitting Physician Ron Parker, MD  Outpatient Primary MD for the patient is [name], [name], DO  LOS - 2   Chief Complaint  Patient presents with  . Hypoglycemia        Assessment & Plan    Principal Problem:   Metabolic encephalopathy Active Problems:   DIABETES MELLITUS   HYPERTENSION   Vascular dementia   Hypoglycemia   Acute blood loss anemia   Dehydration   GI bleed   UTI (lower urinary tract infection)   Hyponatremia  Metabolic Encephalopathy -Secondary to multifactorial causes: UTI, hypoglycemia.   -Will continue to montior.  -Daughter at bedside and states patient is more somnolent today.  Urinary Tract Infection -Pending urine culture.   -Will continue IV ceftriaxone. -Patient is afebrile with no leukocytosis.  Acute Blood Loss Anemia-  -Likely GIBleed as patient was occult positive.  -Will continue to monitor H/H and IV protonix. Hemoglobin has remained stable. -Discussed with daughter possible colonscopy and GI consult.  She stated that at this time she does not feel her mother can handle the procedure and declined.  Hypoglycemia Continue ISS with CBG.    Dehydration Continue IV fluids.  Vascular Dementia Continue Namenda.  Code Status: DNR  Family Communication: Daughter at bedside.  Disposition Plan: Admitted   Procedures None  Consults  None  DVT Prophylaxis SCDs   Lab Results  Component Value Date   PLT 273 08/29/2013    Medications  Scheduled Meds: . amLODipine  5 mg Oral Daily  . carbidopa-levodopa  0.5 tablet Oral Q8H  . cefTRIAXone (ROCEPHIN)  IV  1 g Intravenous Q24H  . cholecalciferol  2,000 Units Oral Daily  . dorzolamide-timolol  1 drop Both Eyes BID  . escitalopram  10 mg Oral Daily  .  insulin aspart  0-9 Units Subcutaneous Q4H  . insulin aspart  5 Units Subcutaneous TID WC  . memantine  10 mg Oral BID  . pantoprazole (PROTONIX) IV  40 mg Intravenous Q12H   Continuous Infusions: . sodium chloride 75 mL/hr at 08/28/13 2046   PRN Meds:.acetaminophen, acetaminophen, alum & mag hydroxide-simeth, cloNIDine, HYDROmorphone (DILAUDID) injection, ondansetron (ZOFRAN) IV, ondansetron, oxyCODONE, RESOURCE THICKENUP CLEAR  Antibiotics    Anti-infectives   Start     Dose/Rate Route Frequency Ordered Stop   08/29/13 0000  cefUROXime (CEFTIN) 250 MG tablet     250 mg Oral 2 times daily 08/29/13 1044     08/28/13 1800  cefTRIAXone (ROCEPHIN) 1 g in dextrose 5 % 50 mL IVPB     1 g 100 mL/hr over 30 Minutes Intravenous Every 24 hours 08/27/13 2013     08/27/13 1900  cefTRIAXone (ROCEPHIN) 1 g in dextrose 5 % 50 mL IVPB     1 g 100 mL/hr over 30 Minutes Intravenous  Once 08/27/13 1857 08/27/13 1959       Time Spent in minutes   25  minutes   Anjali Manzella D.O. on 08/29/2013 at 11:26 AM  Between 7am to 7pm - Pager - [phone]  After 7pm go to [email or web address] - password [email or web address]  And look for the night coverage person covering for me after hours  [facility] Office  [phone]    Subjective:   [name] seen and examined today. Patient awake, however, unable to answer questions due to her mental status.   Objective:   Filed Vitals:   08/28/13 1456 08/28/13 1756 08/28/13 2014 08/29/13 0648  BP: 105/46 134/54 126/52 123/52  Pulse: 86 63 59 78  Temp: 97.9 F (36.6 C) 98 F (36.7 C) 98.2 F (36.8 C) 97.8 F (36.6 C)  TempSrc: Oral Oral Oral   Resp: 19 18 18    Height:      Weight:   67.314 kg (148 lb 6.4 oz)   SpO2: 92% 100% 98% 94%    Wt Readings from Last 3 Encounters:  08/28/13 67.314 kg (148 lb 6.4 oz)  08/25/13 70.761 kg (156 lb)  08/25/13 70.761 kg (156 lb)     Intake/Output Summary (Last 24 hours) at 08/29/13 1126 Last data  filed at 08/29/13 0646  Gross per 24 hour  Intake 1242.5 ml  Output      0 ml  Net 1242.5 ml    Exam  General: Well developed, well nourished, NAD, appears stated age  HEENT: NCAT, PERRLA, EOMI, Anicteic Sclera, mucous membranes moist. No pharyngeal erythema or exudates  Neck: Supple, no JVD, no masses,  Cardiovascular: S1 S2 auscultated, no rubs, murmurs or gallops.  No ventricular heave. Regular rate and rhythm.  Respiratory: Clear to auscultation bilaterally with equal chest rise  Abdomen: Soft, obese, non-tender to palpation, + bowel sounds  Extremities: warm dry without cyanosis clubbing or edema.  Amputation of right 2nd toe to the MTP, sutures present, clean.  No signs of erythema or infection.  Neuro: Awake, unable to answer questions.  Skin: Without rashes exudates or nodules  Psych: Unable to assess.  Data Review   Micro Results Recent Results (from the past 240 hour(s))  URINE CULTURE     Status: None   Collection Time    08/27/13  5:47 PM      Result Value Range Status   Specimen Description URINE, CATHETERIZED   Final   Special Requests NONE   Final   Culture  Setup Time     Final   Value: 08/28/2013 03:26     Performed at [facility]   Colony Count     Final   Value: NO GROWTH     Performed at [facility]   Culture     Final   Value: NO GROWTH     Performed at [facility]   Report Status 08/29/2013 FINAL   Final    Radiology Reports Dg Chest Portable 1 View  08/27/2013   CLINICAL DATA:  Hypoglycemia, unresponsive. Hypertension, diabetes  EXAM: PORTABLE CHEST - 1 VIEW  COMPARISON:  01/19/2011  FINDINGS: The heart size and mediastinal contours are within normal limits. Both lungs are clear. The visualized skeletal structures are unremarkable.  IMPRESSION: No active disease.   Electronically Signed   By: Charlett Nose M.D.   On: 08/27/2013 16:21    CBC  Recent Labs Lab 08/27/13 1533 08/28/13 0526 08/28/13 1356  08/28/13 1955 08/29/13 0555  WBC 10.8* 9.5  --   --  8.8  HGB 8.3* 9.1* 8.7* 8.7* 8.4*  HCT 25.4*  27.6* 26.4* 26.7* 26.2*  PLT 266 279  --   --  273  MCV 91.4 92.3  --   --  91.6  MCH 29.9 30.4  --   --  29.4  MCHC 32.7 33.0  --   --  32.1  RDW 13.0 12.9  --   --  12.9  LYMPHSABS 0.9  --   --   --   --   MONOABS 0.9  --   --   --   --   EOSABS 0.0  --   --   --   --   BASOSABS 0.0  --   --   --   --     Chemistries   Recent Labs Lab 08/25/13 0907 08/27/13 1533 08/28/13 0526  NA 137 130* 135  K 4.0 3.8 4.0  CL  --  96 100  CO2  --  26 26  GLUCOSE 103* 311* 98  BUN  --  13 10  CREATININE  --  0.65 0.55  CALCIUM  --  8.5 8.8   ------------------------------------------------------------------------------------------------------------------ estimated creatinine clearance is 42.9 ml/min (by C-G formula based on Cr of 0.55). ------------------------------------------------------------------------------------------------------------------ No results found for this basename: HGBA1C,  in the last 72 hours ------------------------------------------------------------------------------------------------------------------ No results found for this basename: CHOL, HDL, LDLCALC, TRIG, CHOLHDL, LDLDIRECT,  in the last 72 hours ------------------------------------------------------------------------------------------------------------------ No results found for this basename: TSH, T4TOTAL, FREET3, T3FREE, THYROIDAB,  in the last 72 hours ------------------------------------------------------------------------------------------------------------------ No results found for this basename: VITAMINB12, FOLATE, FERRITIN, TIBC, IRON, RETICCTPCT,  in the last 72 hours  Coagulation profile No results found for this basename: INR, PROTIME,  in the last 168 hours  No results found for this basename: DDIMER,  in the last 72 hours  Cardiac Enzymes No results found for this basename: CK, CKMB,  TROPONINI, MYOGLOBIN,  in the last 168 hours ------------------------------------------------------------------------------------------------------------------ No components found with this basename: POCBNP,

## 2013-08-29 NOTE — Consult Note (Signed)
WOC wound consult note Reason for Consult:Initial consult for patient of vascular services who is s/p amputation of the right foot second digit on 08/25/13.  Patient returned to SNF and was readmitted for change in neurological status and fluctuations in blood glucose levels.No orders for care of surgical incision site. Wound type:surgical Pressure Ulcer POA: No Measurement:2.5cm x 0cm x 0cm (wound is closely approximated and closed with at least one suture (another may be encrusted with dried blood) Wound ZOX:WRUE Drainage (amount, consistency, odor) dried blood at incision site. Periwound: No moisture, some discoloration presents in a 3cm x 4cm area on the anterior aspect of the foot.  This is not a new finding. Dressing procedure/placement/frequency:I recommend letting vascular ([facility]) know of patient's admission; they may wish to see or minimally give the nursing staff guidance for suture removal at either 7 or 10 days.  In the meantime, I will have the nursing staff clean this area once daily and place a clean, dry dressing and secure lightly with paper tape. WOC nursing team will not follow, but will remain available to this patient, the nursing and medical/surgical teams.  Please re-consult if needed. Thanks, Ladona Mow, MSN, RN, GNP, [place], [facility] [phone])

## 2013-08-29 NOTE — Progress Notes (Signed)
INITIAL NUTRITION ASSESSMENT  DOCUMENTATION CODES Per approved criteria  -Not Applicable   INTERVENTION: Provide Glucerna Shakes BID Provide Ensure Pudding once daily  NUTRITION DIAGNOSIS: Inadequate oral intake related to dementia/lethargy as evidenced by pt refusing meals and 5% wt loss in less than one month.  Goal: Pt to meet >/= 90% of their estimated nutrition needs   Monitor:  PO intake Weight Labs  Reason for Assessment: Consult  79 y.o. female  Admitting Dx: Metabolic encephalopathy  ASSESSMENT: 79 y.o. female resident of the [facility] who was sent to the ED due to decreased responsiveness over the past 2 days. She underwent an amputation of her Right 2nd Toe on 10/22. Her daughter reports that since the surgery she has been lethargic And has had decreased responsiveness, and has had decreased intake of foods and liquids. In the ED she was evaluated and was found to have a glucose level of 33. Also a UA was positive, and she was placed on IV Rocephin and referred for admission. Pt has history of FTT, diabetes, dementia, and reflux esophagitis. Pt to d/c back to SNF today.   Per RN note, pt refused to eat breakfast and lunch today; pt ate a few bites at each meal. Pt is more lethargic and less interactive today per RN.   Height: Ht Readings from Last 1 Encounters:  08/28/13 5\' 2"  (1.575 m)    Weight: Wt Readings from Last 1 Encounters:  08/28/13 148 lb 6.4 oz (67.314 kg)    Ideal Body Weight: 110 lbs  % Ideal Body Weight: 135%  Wt Readings from Last 10 Encounters:  08/28/13 148 lb 6.4 oz (67.314 kg)  08/25/13 156 lb (70.761 kg)  08/25/13 156 lb (70.761 kg)  08/19/13 155 lb (70.308 kg)  05/21/13 155 lb (70.308 kg)  03/10/13 160 lb (72.576 kg)  09/26/08 131 lb (59.421 kg)  05/23/08 138 lb 8 oz (62.823 kg)  02/15/08 133 lb 6.1 oz (60.501 kg)  11/17/07 136 lb 8 oz (61.916 kg)    Usual Body Weight: 155 lbs  % Usual Body Weight: 95%  BMI:  Body  mass index is 27.14 kg/(m^2).  Estimated Nutritional Needs: Kcal: 1500-1700 Protein: 80-90 grams Fluid: 2 L/day  Skin: right toe incision  Diet Order: Dysphagia  EDUCATION NEEDS: -No education needs identified at this time   Intake/Output Summary (Last 24 hours) at 08/29/13 1401 Last data filed at 08/29/13 0906  Gross per 24 hour  Intake 1302.5 ml  Output      0 ml  Net 1302.5 ml    Last BM: 10/26   Labs:   Recent Labs Lab 08/25/13 0907 08/27/13 1533 08/28/13 0526  NA 137 130* 135  K 4.0 3.8 4.0  CL  --  96 100  CO2  --  26 26  BUN  --  13 10  CREATININE  --  0.65 0.55  CALCIUM  --  8.5 8.8  GLUCOSE 103* 311* 98    CBG (last 3)   Recent Labs  08/29/13 0011 08/29/13 0431 08/29/13 0748  GLUCAP 181* 79 212*    Scheduled Meds: . amLODipine  5 mg Oral Daily  . carbidopa-levodopa  0.5 tablet Oral Q8H  . cefTRIAXone (ROCEPHIN)  IV  1 g Intravenous Q24H  . cholecalciferol  2,000 Units Oral Daily  . dorzolamide-timolol  1 drop Both Eyes BID  . escitalopram  10 mg Oral Daily  . insulin aspart  0-9 Units Subcutaneous Q4H  . insulin aspart  5 Units Subcutaneous TID WC  . memantine  10 mg Oral BID  . pantoprazole (PROTONIX) IV  40 mg Intravenous Q12H    Continuous Infusions: . sodium chloride 75 mL/hr at 08/28/13 2046    Past Medical History  Diagnosis Date  . Fall   . FTT (failure to thrive) in adult   . Anemia   . Diabetes mellitus without complication   . Anxiety   . Glaucoma   . Acute bronchitis 11/22/2011  . Unspecified vitamin D deficiency 08/30/2011  . Vascular dementia, uncomplicated 02/26/2011  . Unspecified late effects of cerebrovascular disease 11/18/2008  . Reflux esophagitis 11/18/2008  . Ulcer of other part of foot 11/18/2008  . Shortness of breath 11/18/2008  . Paralysis agitans 11/03/2008  . Unspecified essential hypertension 11/03/2008  . Irritable bowel syndrome 11/03/2008  . Ulcer     right second toe    Past Surgical  History  Procedure Laterality Date  . Amputation Right 08/25/2013    Procedure: AMPUTATION OF RIGHT 2ND TOE;  Surgeon: Sherren Kerns, MD;  Location: [facility] OR;  Service: Vascular;  Laterality: Right;    Ian Malkin RD, LDN Inpatient Clinical Dietitian Pager: [phone] After Hours Pager: [phone]

## 2013-08-30 DIAGNOSIS — F411 Generalized anxiety disorder: Secondary | ICD-10-CM

## 2013-08-30 LAB — GLUCOSE, CAPILLARY
Glucose-Capillary: 121 mg/dL — ABNORMAL HIGH (ref 70–99)
Glucose-Capillary: 135 mg/dL — ABNORMAL HIGH (ref 70–99)
Glucose-Capillary: 138 mg/dL — ABNORMAL HIGH (ref 70–99)
Glucose-Capillary: 190 mg/dL — ABNORMAL HIGH (ref 70–99)
Glucose-Capillary: 247 mg/dL — ABNORMAL HIGH (ref 70–99)
Glucose-Capillary: 87 mg/dL (ref 70–99)

## 2013-08-30 MED ORDER — GLUCERNA SHAKE PO LIQD: 237.0000 mL | Freq: Two times a day (BID) | ORAL | Status: DC

## 2013-08-30 MED ORDER — ENSURE PUDDING PO PUDG: 1.0000 | ORAL | Status: DC

## 2013-08-30 NOTE — Clinical Social Work Note (Signed)
Patient medically stable for discharge back to [facility] today. Discharge summary forwarded to facility and discharge packet compiled. CSW facilitated transport to facility via ambulance. Patient's daughter aware of discharge.   Genelle Bal, MSW, LCSW [phone]

## 2013-08-30 NOTE — Progress Notes (Signed)
08/30/2013 patient told daughter she wanted to die. Rn spoke to patient and Md is aware. [facility] RN.

## 2013-08-31 ENCOUNTER — Encounter: Payer: Self-pay | Admitting: Internal Medicine

## 2013-08-31 ENCOUNTER — Non-Acute Institutional Stay (SKILLED_NURSING_FACILITY): Payer: Medicare Other | Admitting: Internal Medicine

## 2013-08-31 DIAGNOSIS — F0280 Dementia in other diseases classified elsewhere without behavioral disturbance: Secondary | ICD-10-CM

## 2013-08-31 DIAGNOSIS — I10 Essential (primary) hypertension: Secondary | ICD-10-CM

## 2013-08-31 DIAGNOSIS — E1159 Type 2 diabetes mellitus with other circulatory complications: Secondary | ICD-10-CM

## 2013-08-31 DIAGNOSIS — I739 Peripheral vascular disease, unspecified: Secondary | ICD-10-CM

## 2013-08-31 DIAGNOSIS — G20A1 Parkinson's disease without dyskinesia, without mention of fluctuations: Secondary | ICD-10-CM

## 2013-08-31 DIAGNOSIS — I743 Embolism and thrombosis of arteries of the lower extremities: Secondary | ICD-10-CM

## 2013-08-31 DIAGNOSIS — I798 Other disorders of arteries, arterioles and capillaries in diseases classified elsewhere: Secondary | ICD-10-CM

## 2013-08-31 DIAGNOSIS — I779 Disorder of arteries and arterioles, unspecified: Secondary | ICD-10-CM

## 2013-08-31 DIAGNOSIS — G20 Parkinson's disease: Secondary | ICD-10-CM

## 2013-08-31 DIAGNOSIS — I70209 Unspecified atherosclerosis of native arteries of extremities, unspecified extremity: Secondary | ICD-10-CM

## 2013-08-31 DIAGNOSIS — L98499 Non-pressure chronic ulcer of skin of other sites with unspecified severity: Secondary | ICD-10-CM

## 2013-08-31 NOTE — Progress Notes (Signed)
Patient ID: [name], female   DOB: September 24, 1922, 79 y.o.   MRN: [id number] Provider:  Gwenith Spitz. Renato Gails, D.O., C.M.D. Location:  [facility]  PCP: Bufford Spikes, DO  Code Status: DNR   Allergies  Allergen Reactions  . Fish Allergy Other (See Comments)    Reaction unspecified on MAR  . Iodine Other (See Comments)    Reaction unspecified on MAR  . Shellfish Allergy Other (See Comments)    Reaction unspecified on [facility]    Chief Complaint  Patient presents with  . Hospitalization Follow-up    gangrene right second toe, diabetes mellitus II    HPI: 79 y.o. female with h/o Parkinson's disease with dementia with failure to thrive, anemia, DMII with circulatory complications, htn, IBS and recent difficulty with right second toe trauma that was not healing.  She'd had trauma to the toe during a podiatry visit, and developed gangrene of the toe.  Her ABIs revealed moderate to severe arterial disease distal to the popliteal artery.  She was admitted for amputation of the second right toe by Dr. Darrick Penna.  Her stay was complicated by difficult to control glucose.  Otherwise, she has done well.    ROS: Review of Systems  Unable to perform ROS: dementia     Past Medical History  Diagnosis Date  . Fall   . FTT (failure to thrive) in adult   . Anemia   . Diabetes mellitus without complication   . Anxiety   . Glaucoma   . Acute bronchitis 11/22/2011  . Unspecified vitamin D deficiency 08/30/2011  . Vascular dementia, uncomplicated 02/26/2011  . Unspecified late effects of cerebrovascular disease 11/18/2008  . Reflux esophagitis 11/18/2008  . Ulcer of other part of foot 11/18/2008  . Shortness of breath 11/18/2008  . Paralysis agitans 11/03/2008  . Unspecified essential hypertension 11/03/2008  . Irritable bowel syndrome 11/03/2008  . Ulcer     right second toe   Past Surgical History  Procedure Laterality Date  . Amputation Right 08/25/2013    Procedure:  AMPUTATION OF RIGHT 2ND TOE;  Surgeon: Sherren Kerns, MD;  Location: [facility] OR;  Service: Vascular;  Laterality: Right;   Social History:   reports that she has never smoked. She has never used smokeless tobacco. She reports that she does not drink alcohol or use illicit drugs.  Family History  Problem Relation Age of Onset  . Stroke Mother   . Diabetes Mother   . Diabetes Sister      x 2  . Hypertension Mother   . Lung cancer Father     Medications: Patient's Medications  New Prescriptions   No medications on file  Previous Medications   ACETAMINOPHEN (TYLENOL) 500 MG TABLET    Take 1,000 mg by mouth 2 (two) times daily.   ALUM & MAG HYDROXIDE-SIMETH (MAALOX/MYLANTA) 200-200-20 MG/5ML SUSPENSION    Take 30 mLs by mouth every 6 (six) hours as needed.   AMLODIPINE (NORVASC) 5 MG TABLET    Take 5 mg by mouth daily.   CARBIDOPA-LEVODOPA (SINEMET IR) 25-100 MG PER TABLET    Take 0.5 tablets by mouth every 8 (eight) hours.    CEFUROXIME (CEFTIN) 250 MG TABLET    Take 1 tablet (250 mg total) by mouth 2 (two) times daily.   CHOLECALCIFEROL 2000 UNITS CAPS    Take 1 capsule by mouth daily.   CLONIDINE (CATAPRES) 0.1 MG TABLET    Take 0.1 mg by mouth daily as needed (sbp >180).  DORZOLAMIDE-TIMOLOL (COSOPT) 22.3-6.8 MG/ML OPHTHALMIC SOLUTION    Place 1 drop into both eyes 2 (two) times daily.   ESCITALOPRAM (LEXAPRO) 10 MG TABLET    Take 10 mg by mouth daily.   FEEDING SUPPLEMENT, ENSURE, (ENSURE) PUDG    Take 1 Container by mouth daily.   FEEDING SUPPLEMENT, GLUCERNA SHAKE, (GLUCERNA SHAKE) LIQD    Take 237 mLs by mouth 2 (two) times daily between meals.   FOOD THICKENER (THICK IT) POWD    Take 1 Container by mouth as needed (for honey thick liquids).   INSULIN ASPART (NOVOLOG) 100 UNIT/ML INJECTION    Inject 5 Units into the skin 3 (three) times daily with meals. For cbg >=150 prior to meals   INSULIN DETEMIR (LEVEMIR) 100 UNIT/ML INJECTION    Inject 10 Units into the skin at bedtime.     MAGNESIUM HYDROXIDE (MILK OF MAGNESIA) 400 MG/5ML SUSPENSION    Take 30 mLs by mouth daily as needed for constipation.   MEMANTINE HCL ER (NAMENDA XR) 28 MG CP24    Take 1 capsule by mouth daily.   MULTIPLE VITAMIN (MULTIVITAMIN) TABLET    Take 1 tablet by mouth daily.   TRAMADOL (ULTRAM) 50 MG TABLET    Take 1 tablet (50 mg total) by mouth every 8 (eight) hours as needed for pain.  Modified Medications   No medications on file  Discontinued Medications   No medications on file     Physical Exam: Filed Vitals:   08/31/13 1204  BP: 129/65  Pulse: 80  Temp: 98 F (36.7 C)  Resp: 20  Height: 5\' 1"  (1.549 m)  Weight: 160 lb (72.576 kg)  SpO2: 96%   Physical Exam  Constitutional: She appears well-developed and well-nourished. No distress.  HENT:  Head: Normocephalic and atraumatic.  Right Ear: External ear normal.  Left Ear: External ear normal.  Nose: Nose normal.  Mouth/Throat: Oropharynx is clear and moist.  Eyes: Conjunctivae and EOM are normal. Pupils are equal, round, and reactive to light.  Neck: Normal range of motion.  Cardiovascular: Normal rate, regular rhythm, normal heart sounds and intact distal pulses.   Pulmonary/Chest: Effort normal and breath sounds normal. No respiratory distress.  Abdominal: Soft. Bowel sounds are normal. She exhibits no distension. There is no tenderness.  Musculoskeletal: Normal range of motion. She exhibits no edema and no tenderness.  Neurological: She is alert. No cranial nerve deficit. She exhibits abnormal muscle tone.  Resting tremor   Skin: Skin is warm and dry.  Right second toe has been amputated, sutures and scabbing in place, foot warm and dry, no drainage     Labs reviewed: Basic Metabolic Panel:  Recent Labs  16/10/96 0907 08/27/13 1533 08/28/13 0526  NA 137 130* 135  K 4.0 3.8 4.0  CL  --  96 100  CO2  --  26 26  GLUCOSE 103* 311* 98  BUN  --  13 10  CREATININE  --  0.65 0.55  CALCIUM  --  8.5 8.8   CBC:  Recent Labs  08/27/13 1533 08/28/13 0526 08/28/13 1356 08/28/13 1955 08/29/13 0555  WBC 10.8* 9.5  --   --  8.8  NEUTROABS 8.9*  --   --   --   --   HGB 8.3* 9.1* 8.7* 8.7* 8.4*  HCT 25.4* 27.6* 26.4* 26.7* 26.2*  MCV 91.4 92.3  --   --  91.6  PLT 266 279  --   --  273  CBG:  Recent  Labs  08/30/13 0733 08/30/13 1131 08/30/13 1801  GLUCAP 138* 135* 190*   Imaging and Procedures: CXR:  08/27/13:  No active disease.  Assessment/Plan 1. DM type 2 causing vascular disease -had been well controlled until surgery -elevated cbgs at hospital -here, has been satisfactory especially considering her recent surgery 190-206  2. Peripheral arterial occlusive disease -moderate to severe by ABI earlier this month -following with vascular surgery, Dr. Darrick Penna  3. Atherosclerotic PVD with ulceration -ulceration developed that did not heal--has now had amputation of the second right toe that was site of ulceration and gangrene  4. Parkinson's disease -advanced and complicated by dementia -cont sinemet  5. Dementia in Parkinson's disease -severe--also may have some vascular etiology, as well  6. Unspecified essential hypertension -at goal, no changes  Functional status: dependent in adls  Family/ staff Communication: pt was seen with unit supervisor  Labs/tests ordered:  Cbc, bmp next draw

## 2013-09-02 ENCOUNTER — Encounter: Payer: Self-pay | Admitting: Internal Medicine

## 2013-09-08 ENCOUNTER — Encounter: Payer: Self-pay | Admitting: Vascular Surgery

## 2013-09-09 ENCOUNTER — Encounter: Payer: Self-pay | Admitting: Vascular Surgery

## 2013-09-09 ENCOUNTER — Ambulatory Visit (INDEPENDENT_AMBULATORY_CARE_PROVIDER_SITE_OTHER): Payer: Self-pay | Admitting: Vascular Surgery

## 2013-09-09 VITALS — BP 145/60 | HR 81 | Temp 97.8°F | Ht 61.0 in | Wt 160.0 lb

## 2013-09-09 DIAGNOSIS — I7025 Atherosclerosis of native arteries of other extremities with ulceration: Secondary | ICD-10-CM | POA: Insufficient documentation

## 2013-09-09 DIAGNOSIS — I739 Peripheral vascular disease, unspecified: Secondary | ICD-10-CM

## 2013-09-09 NOTE — Progress Notes (Signed)
Patient is an 79 year old female who is status post amputation of her right second toe. She returns today for followup. She is demented. Her family is with her for the office visit today.  Physical exam:  Filed Vitals:   09/09/13 1425  BP: 145/60  Pulse: 81  Temp: 97.8 F (36.6 C)  TempSrc: Oral  Height: 5\' 1"  (1.549 m)  Weight: 160 lb (72.576 kg)  SpO2: 92%    Right second toe indication site has some wetness and maceration of the skin small amount of serous fluid no erythema no fluctuance  Assessment: Slowly healing right second toe amputation Plan: Remove sutures today. Followup in 2 weeks to recheck. Still at high risk for limb loss. They will wash the wound once daily with soap and water at her nursing facility.  Fabienne Bruns, MD [facility] Office: [phone] Pager: [phone]

## 2013-09-17 ENCOUNTER — Other Ambulatory Visit: Payer: Self-pay | Admitting: *Deleted

## 2013-09-17 MED ORDER — TRAMADOL HCL 50 MG PO TABS: 50.0000 mg | ORAL_TABLET | Freq: Three times a day (TID) | ORAL | Status: DC | PRN

## 2013-09-22 ENCOUNTER — Encounter: Payer: Self-pay | Admitting: Vascular Surgery

## 2013-09-23 ENCOUNTER — Encounter: Payer: Self-pay | Admitting: Vascular Surgery

## 2013-09-23 ENCOUNTER — Ambulatory Visit (INDEPENDENT_AMBULATORY_CARE_PROVIDER_SITE_OTHER): Payer: Self-pay | Admitting: Vascular Surgery

## 2013-09-23 VITALS — BP 129/48 | HR 173 | Ht 61.0 in | Wt 160.0 lb

## 2013-09-23 DIAGNOSIS — I739 Peripheral vascular disease, unspecified: Secondary | ICD-10-CM

## 2013-09-23 NOTE — Progress Notes (Signed)
Patient is an 79 year old female who is status post amputation of her right second toe.  This was done October 22.  She returns today for followup. She is demented. Her family is with her for the office visit today. She has an early right heel decubitus ulcer. She has an early left heel decubitus ulcer.  Physical exam:     Filed Vitals:   09/23/13 1341  BP: 129/48  Pulse: 173  Height: 5\' 1"  (1.549 m)  Weight: 160 lb (72.576 kg)  SpO2: 98%   Right second toe amputation site has some dry eschar no significant wetness and maceration of the skin small amount of serous fluid no erythema no fluctuance  Assessment: Slowly healing right second toe amputation Plan: Followup one month Still at high risk for limb loss. They will wash the wound once daily with soap and water at her nursing facility. Dry gauze dressing once daily. Elevate heels off the bed and also wheelchair to prevent further breakdown of skin on her heel.  Fabienne Bruns, MD [facility] Office: [phone] Pager: [phone]

## 2013-10-20 ENCOUNTER — Non-Acute Institutional Stay (SKILLED_NURSING_FACILITY): Payer: Medicare Other | Admitting: Internal Medicine

## 2013-10-20 ENCOUNTER — Encounter: Payer: Self-pay | Admitting: Internal Medicine

## 2013-10-20 DIAGNOSIS — I779 Disorder of arteries and arterioles, unspecified: Secondary | ICD-10-CM

## 2013-10-20 DIAGNOSIS — G20 Parkinson's disease: Secondary | ICD-10-CM

## 2013-10-20 DIAGNOSIS — G20A1 Parkinson's disease without dyskinesia, without mention of fluctuations: Secondary | ICD-10-CM

## 2013-10-20 DIAGNOSIS — E1159 Type 2 diabetes mellitus with other circulatory complications: Secondary | ICD-10-CM

## 2013-10-20 DIAGNOSIS — I743 Embolism and thrombosis of arteries of the lower extremities: Secondary | ICD-10-CM

## 2013-10-20 DIAGNOSIS — F0280 Dementia in other diseases classified elsewhere without behavioral disturbance: Secondary | ICD-10-CM

## 2013-10-20 DIAGNOSIS — I798 Other disorders of arteries, arterioles and capillaries in diseases classified elsewhere: Secondary | ICD-10-CM

## 2013-10-20 NOTE — Progress Notes (Signed)
Patient ID: [name], female   DOB: 1922/05/24, 79 y.o.   MRN: [id number]  Location:  [facility] Provider:  Gwenith Spitz. Renato Gails, D.O., C.M.D.  Code Status: DNR, palliative care  Chief Complaint  Patient presents with  . Medical Managment of Chronic Issues    HPI:  79 yo black female with h/o Parkinson's with dementia, DMII seen for medical mgt of chronic diseases.  She has been declining lately--less verbal, has been losing weight.   Review of Systems:  Review of Systems  Constitutional: Negative for fever.  Respiratory: Negative for shortness of breath.   Cardiovascular: Negative for chest pain.  Gastrointestinal: Negative for constipation.  Genitourinary: Negative for dysuria.  Musculoskeletal: Negative for falls.  Neurological: Negative for loss of consciousness.  Endo/Heme/Allergies: Does not bruise/bleed easily.  Psychiatric/Behavioral: Positive for memory loss.    Medications: Patient's Medications  New Prescriptions   No medications on file  Previous Medications   ACETAMINOPHEN (TYLENOL) 500 MG TABLET    Take 1,000 mg by mouth 2 (two) times daily.   AMLODIPINE (NORVASC) 5 MG TABLET    Take 5 mg by mouth daily.   CARBIDOPA-LEVODOPA (SINEMET IR) 25-100 MG PER TABLET    Take 0.5 tablets by mouth every 8 (eight) hours.    CEFUROXIME (CEFTIN) 250 MG TABLET    Take 1 tablet (250 mg total) by mouth 2 (two) times daily.   CHOLECALCIFEROL 2000 UNITS CAPS    Take 1 capsule by mouth daily.   CLONIDINE (CATAPRES) 0.1 MG TABLET    Take 0.1 mg by mouth daily as needed (sbp >180).   DORZOLAMIDE-TIMOLOL (COSOPT) 22.3-6.8 MG/ML OPHTHALMIC SOLUTION    Place 1 drop into both eyes 2 (two) times daily.   FEEDING SUPPLEMENT, ENSURE, (ENSURE) PUDG    Take 1 Container by mouth daily.   FEEDING SUPPLEMENT, GLUCERNA SHAKE, (GLUCERNA SHAKE) LIQD    Take 237 mLs by mouth 2 (two) times daily between meals.   FOOD THICKENER (THICK IT) POWD    Take 1 Container by mouth as needed  (for honey thick liquids).   INSULIN ASPART (NOVOLOG) 100 UNIT/ML INJECTION    Inject 5 Units into the skin 3 (three) times daily with meals. For cbg >=150 prior to meals   INSULIN DETEMIR (LEVEMIR) 100 UNIT/ML INJECTION    Inject 10 Units into the skin at bedtime.    MAGNESIUM HYDROXIDE (MILK OF MAGNESIA) 400 MG/5ML SUSPENSION    Take 30 mLs by mouth daily as needed for constipation.   MULTIPLE VITAMIN (MULTIVITAMIN) TABLET    Take 1 tablet by mouth daily.  Modified Medications   No medications on file  Discontinued Medications   ALUM & MAG HYDROXIDE-SIMETH (MAALOX/MYLANTA) 200-200-20 MG/5ML SUSPENSION    Take 30 mLs by mouth every 6 (six) hours as needed.   ESCITALOPRAM (LEXAPRO) 10 MG TABLET    Take 10 mg by mouth daily.   MEMANTINE HCL ER (NAMENDA XR) 28 MG CP24    Take 1 capsule by mouth daily.   TRAMADOL (ULTRAM) 50 MG TABLET    Take 1 tablet (50 mg total) by mouth every 8 (eight) hours as needed.    Physical Exam: Filed Vitals:   10/20/13 1435  BP: 128/64  Pulse: 68  Temp: 97.1 F (36.2 C)  Resp: 18  Height: 5\' 4"  (1.626 m)  Weight: 151 lb (68.493 kg)  Physical Exam  Constitutional: No distress.  Very pleasant black female  Cardiovascular: Normal rate, regular rhythm and normal heart sounds.  Pulmonary/Chest: Effort normal and breath sounds normal. No respiratory distress.  Abdominal: Soft. Bowel sounds are normal.  Musculoskeletal: Normal range of motion.  Neurological: She is alert.  Confused, answers some questions appropriately  Skin: Skin is warm and dry.  Amputated toe  Psychiatric: She has a normal mood and affect.     Labs reviewed: Basic Metabolic Panel:  Recent Labs  82/95/62 0907 08/27/13 1533 08/28/13 0526  NA 137 130* 135  K 4.0 3.8 4.0  CL  --  96 100  CO2  --  26 26  GLUCOSE 103* 311* 98  BUN  --  13 10  CREATININE  --  0.65 0.55  CALCIUM  --  8.5 8.8    CBC:  Recent Labs  08/27/13 1533 08/28/13 0526 08/28/13 1356 08/28/13 1955  08/29/13 0555  WBC 10.8* 9.5  --   --  8.8  NEUTROABS 8.9*  --   --   --   --   HGB 8.3* 9.1* 8.7* 8.7* 8.4*  HCT 25.4* 27.6* 26.4* 26.7* 26.2*  MCV 91.4 92.3  --   --  91.6  PLT 266 279  --   --  273  08/20/13:  hba1c 7.5, Na 136, K 4.5, BUN 10, cr 0.62 09/06/13:  Wbc 5.1, h/h 8.3/25.2, plts 369; Na 138, K 4.4, BUN 14, cr 0.72, Ca 8.4 09/22/13:  Na 135, K 4.5, BUN 13, cr 0.57, hba1c 7.1  Significant Diagnostic Results:  Reviewed notes from Dr. Darrick Penna  Assessment/Plan 1. Peripheral arterial occlusive disease -s/p amputation of toe due to ulceration that developed after podiatric treatment  2. Dementia in Parkinson's disease -is progressing with some weight loss and decreased verbalization  3. Parkinson's disease -continues sinemet  4. DM type 2 causing vascular disease -cont levemir and novolog  Family/ staff Communication: seen with unit supervisor  Goals of care: palliative care pt

## 2013-11-04 ENCOUNTER — Encounter (HOSPITAL_COMMUNITY): Payer: Self-pay | Admitting: Emergency Medicine

## 2013-11-04 ENCOUNTER — Emergency Department (HOSPITAL_COMMUNITY)
Admission: EM | Admit: 2013-11-04 | Discharge: 2013-11-05 | Disposition: A | Discharge status: Skilled Nursing Facility | Payer: No Typology Code available for payment source | Attending: Emergency Medicine | Admitting: Emergency Medicine

## 2013-11-04 DIAGNOSIS — F0390 Unspecified dementia without behavioral disturbance: Secondary | ICD-10-CM | POA: Insufficient documentation

## 2013-11-04 DIAGNOSIS — S98139A Complete traumatic amputation of one unspecified lesser toe, initial encounter: Secondary | ICD-10-CM | POA: Insufficient documentation

## 2013-11-04 DIAGNOSIS — Z8709 Personal history of other diseases of the respiratory system: Secondary | ICD-10-CM | POA: Diagnosis not present

## 2013-11-04 DIAGNOSIS — Z794 Long term (current) use of insulin: Secondary | ICD-10-CM | POA: Diagnosis not present

## 2013-11-04 DIAGNOSIS — E119 Type 2 diabetes mellitus without complications: Secondary | ICD-10-CM | POA: Insufficient documentation

## 2013-11-04 DIAGNOSIS — E559 Vitamin D deficiency, unspecified: Secondary | ICD-10-CM | POA: Insufficient documentation

## 2013-11-04 DIAGNOSIS — I10 Essential (primary) hypertension: Secondary | ICD-10-CM | POA: Diagnosis not present

## 2013-11-04 DIAGNOSIS — H409 Unspecified glaucoma: Secondary | ICD-10-CM | POA: Diagnosis not present

## 2013-11-04 DIAGNOSIS — R1110 Vomiting, unspecified: Secondary | ICD-10-CM | POA: Diagnosis present

## 2013-11-04 DIAGNOSIS — Z8659 Personal history of other mental and behavioral disorders: Secondary | ICD-10-CM | POA: Insufficient documentation

## 2013-11-04 DIAGNOSIS — Z872 Personal history of diseases of the skin and subcutaneous tissue: Secondary | ICD-10-CM | POA: Diagnosis not present

## 2013-11-04 DIAGNOSIS — Z8673 Personal history of transient ischemic attack (TIA), and cerebral infarction without residual deficits: Secondary | ICD-10-CM | POA: Insufficient documentation

## 2013-11-04 DIAGNOSIS — Z8719 Personal history of other diseases of the digestive system: Secondary | ICD-10-CM | POA: Diagnosis not present

## 2013-11-04 DIAGNOSIS — Z862 Personal history of diseases of the blood and blood-forming organs and certain disorders involving the immune mechanism: Secondary | ICD-10-CM | POA: Diagnosis not present

## 2013-11-04 DIAGNOSIS — Z79899 Other long term (current) drug therapy: Secondary | ICD-10-CM | POA: Diagnosis not present

## 2013-11-04 NOTE — ED Notes (Signed)
[facility]. Hemo + at facility. A/O for EMS. DNR in place. NAD 130/58 88hr 18rr 97%ra. CBG 162

## 2013-11-05 DIAGNOSIS — R1110 Vomiting, unspecified: Secondary | ICD-10-CM | POA: Diagnosis not present

## 2013-11-05 LAB — CBC WITH DIFFERENTIAL/PLATELET
Basophils Absolute: 0 10*3/uL (ref 0.0–0.1)
Basophils Relative: 0 % (ref 0–1)
EOS ABS: 0.1 10*3/uL (ref 0.0–0.7)
Eosinophils Relative: 1 % (ref 0–5)
HCT: 31.7 % — ABNORMAL LOW (ref 36.0–46.0)
Hemoglobin: 10.1 g/dL — ABNORMAL LOW (ref 12.0–15.0)
LYMPHS ABS: 1.5 10*3/uL (ref 0.7–4.0)
Lymphocytes Relative: 15 % (ref 12–46)
MCH: 28.9 pg (ref 26.0–34.0)
MCHC: 31.9 g/dL (ref 30.0–36.0)
MCV: 90.6 fL (ref 78.0–100.0)
Monocytes Absolute: 0.7 10*3/uL (ref 0.1–1.0)
Monocytes Relative: 6 % (ref 3–12)
Neutro Abs: 8 10*3/uL — ABNORMAL HIGH (ref 1.7–7.7)
Neutrophils Relative %: 78 % — ABNORMAL HIGH (ref 43–77)
Platelets: 286 10*3/uL (ref 150–400)
RBC: 3.5 MIL/uL — AB (ref 3.87–5.11)
RDW: 15.4 % (ref 11.5–15.5)
WBC: 10.2 10*3/uL (ref 4.0–10.5)

## 2013-11-05 LAB — BASIC METABOLIC PANEL
BUN: 14 mg/dL (ref 6–23)
CO2: 23 meq/L (ref 19–32)
Calcium: 8.5 mg/dL (ref 8.4–10.5)
Chloride: 105 mEq/L (ref 96–112)
Creatinine, Ser: 0.58 mg/dL (ref 0.50–1.10)
GFR calc Af Amer: >90 mL/min (ref >90)
GFR calc non Af Amer: 79 mL/min — ABNORMAL LOW (ref >90)
Glucose, Bld: 183 mg/dL — ABNORMAL HIGH (ref 70–99)
Potassium: 3.9 mEq/L (ref 3.7–5.3)
SODIUM: 139 meq/L (ref 137–147)

## 2013-11-05 LAB — OCCULT BLOOD, POC DEVICE: Fecal Occult Bld: POSITIVE — AB

## 2013-11-05 NOTE — ED Provider Notes (Signed)
CSN: [id number]     Arrival date & time 11/04/13  2324 History   First MD Initiated Contact with Patient 11/05/13 0209     Chief Complaint  Patient presents with  . Emesis   Patient is a 80 y.o. female presenting with vomiting. The history is provided by a caregiver. The history is limited by the condition of the patient.  Emesis Severity:  Mild Timing:  Intermittent Emesis appearance: brownish material. Progression:  Improving Chronicity:  New Relieved by:  Nothing Worsened by:  Nothing tried   Past Medical History  Diagnosis Date  . Fall   . FTT (failure to thrive) in adult   . Anemia   . Diabetes mellitus without complication   . Anxiety   . Glaucoma   . Acute bronchitis 11/22/2011  . Unspecified vitamin D deficiency 08/30/2011  . Vascular dementia, uncomplicated 02/26/2011  . Unspecified late effects of cerebrovascular disease 11/18/2008  . Reflux esophagitis 11/18/2008  . Ulcer of other part of foot 11/18/2008  . Shortness of breath 11/18/2008  . Paralysis agitans 11/03/2008  . Unspecified essential hypertension 11/03/2008  . Irritable bowel syndrome 11/03/2008  . Ulcer     right second toe   Past Surgical History  Procedure Laterality Date  . Amputation Right 08/25/2013    Procedure: AMPUTATION OF RIGHT 2ND TOE;  Surgeon: Sherren Kernsharles E Fields, MD;  [facility];  Service: Vascular;  Laterality: Right;   Family History  Problem Relation Age of Onset  . Stroke Mother   . Diabetes Mother   . Diabetes Sister      x 2  . Hypertension Mother   . Lung cancer Father    History  Substance Use Topics  . Smoking status: Never Smoker   . Smokeless tobacco: Never Used  . Alcohol Use: No   OB History   Grav Para Term Preterm Abortions TAB SAB Ect Mult Living                 Review of Systems  Unable to perform ROS: Dementia  Gastrointestinal: Positive for vomiting.    Allergies  Contrast media; Fish allergy; Iodine; and Shellfish allergy  Home Medications    Current Outpatient Rx  Name  Route  Sig  Dispense  Refill  . acetaminophen (TYLENOL) 500 MG tablet   Oral   Take 1,000 mg by mouth 2 (two) times daily.         Marland Kitchen. alum & mag hydroxide-simeth (MAALOX/MYLANTA) 200-200-20 MG/5ML suspension   Oral   Take 30 mLs by mouth every 6 (six) hours as needed for indigestion or heartburn.         Marland Kitchen. amLODipine (NORVASC) 5 MG tablet   Oral   Take 5 mg by mouth daily.         . carbidopa-levodopa (SINEMET IR) 25-100 MG per tablet   Oral   Take 0.5 tablets by mouth every 8 (eight) hours.          . Cholecalciferol 2000 UNITS CAPS   Oral   Take 1 capsule by mouth daily.         . cloNIDine (CATAPRES) 0.1 MG tablet   Oral   Take 0.1 mg by mouth daily as needed (sbp >180).         . dorzolamide-timolol (COSOPT) 22.3-6.8 MG/ML ophthalmic solution   Both Eyes   Place 1 drop into both eyes 2 (two) times daily.         . insulin aspart (NOVOLOG)  100 UNIT/ML injection   Subcutaneous   Inject 5 Units into the skin 3 (three) times daily with meals. For cbg >=150 prior to meals         . insulin detemir (LEVEMIR) 100 UNIT/ML injection   Subcutaneous   Inject 10 Units into the skin at bedtime.          . magnesium hydroxide (MILK OF MAGNESIA) 400 MG/5ML suspension   Oral   Take 30 mLs by mouth daily as needed for constipation.         . Multiple Vitamin (MULTIVITAMIN) tablet   Oral   Take 1 tablet by mouth daily.          BP 140/84  Pulse 84  Temp(Src) 98 F (36.7 C) (Oral)  Resp 19  SpO2 100% Physical Exam CONSTITUTIONAL: elderly, frail HEAD: Normocephalic/atraumatic EYES: EOMI/PERRL ENMT: Mucous membranes moist, no evidence of blood to mouth or nose NECK: supple no meningeal signs LUNGS: Lungs are clear to auscultation bilaterally, no apparent distress ABDOMEN: soft, nontender, no rebound or guarding Rectal - stool color is brown.  No blood or melena noted.  Female chaperone present.  Skin breakdown noted to  buttocks, hemoccult positive though suspect this is from blood from skin breakdown NEURO: Pt is awake/alert, moves all extremitiesx4, she is pleasantly demented EXTREMITIES:  full ROM SKIN: warm   ED Course  Procedures (including critical care time)  3:03 AM Pt here from nursing facility for vomiting Per report, vomitus was brown, tested positive for blood She is currently in no distress I just spoke to nursing facility [facility]) and nurse (who was not present) was told patient vomited blood and coffee ground emesis She is not acutely anemic and is not unstable at this time 3:58 AM I had long discussion with daughter patricia at [phone] History from nursing home was that she had coffee ground emesis, but here she is not vomitng, no distress, no melena and no blood in stool.  HGB stable She is not on anticoagulants Even despite DNR status I offered admission, but daughter feels she is safe and would be more comfortable at nursing home and if anything worsens she will return.  She felt she was at her baseline    Labs Review Labs Reviewed  BASIC METABOLIC PANEL - Abnormal; Notable for the following:    Glucose, Bld 183 (*)    GFR calc non Af Amer 79 (*)    All other components within normal limits  CBC WITH DIFFERENTIAL - Abnormal; Notable for the following:    RBC 3.50 (*)    Hemoglobin 10.1 (*)    HCT 31.7 (*)    Neutrophils Relative % 78 (*)    Neutro Abs 8.0 (*)    All other components within normal limits  OCCULT BLOOD, POC DEVICE - Abnormal; Notable for the following:    Fecal Occult Bld POSITIVE (*)    All other components within normal limits   Imaging Review No results found.  EKG Interpretation   None       MDM  No diagnosis found. Nursing notes including past medical history and social history reviewed and considered in documentation Labs/vital reviewed and considered Previous records reviewed and considered     Joya Gaskins,  MD 11/05/13 0400

## 2013-11-05 NOTE — ED Notes (Addendum)
Cleaned pt up from bowel movement. Pt has bleeding and redness to her bottom from ulcers. Applied barrier cream and powder to pts bottom.

## 2013-11-05 NOTE — ED Notes (Signed)
POCT-Occult card Positive.  RN aware of same.

## 2013-11-05 NOTE — ED Notes (Signed)
Pt. Incontinent of stool. Incontinence care done at this time. Pt.'s Peri area is reddened and bleeding close to her anus.

## 2013-11-05 NOTE — ED Notes (Addendum)
Pt. incontinent of stool. Incontinent care done at this time. Peri area continues to be reddened and swollen and bleeds as you wipe away the stool. Barrier cream placed on peri-area and then adult diaper placed.

## 2013-11-10 ENCOUNTER — Encounter: Payer: Self-pay | Admitting: Vascular Surgery

## 2013-11-11 ENCOUNTER — Ambulatory Visit (INDEPENDENT_AMBULATORY_CARE_PROVIDER_SITE_OTHER): Payer: Self-pay | Admitting: Vascular Surgery

## 2013-11-11 ENCOUNTER — Encounter: Payer: Self-pay | Admitting: Vascular Surgery

## 2013-11-11 VITALS — BP 129/63 | HR 78 | Ht 61.0 in | Wt 160.0 lb

## 2013-11-11 DIAGNOSIS — I70209 Unspecified atherosclerosis of native arteries of extremities, unspecified extremity: Secondary | ICD-10-CM

## 2013-11-11 DIAGNOSIS — L98499 Non-pressure chronic ulcer of skin of other sites with unspecified severity: Secondary | ICD-10-CM

## 2013-11-11 DIAGNOSIS — I739 Peripheral vascular disease, unspecified: Secondary | ICD-10-CM

## 2013-11-11 NOTE — Progress Notes (Signed)
Patient is an 80 year old female status post amputation of her right second toe October of 2014. She returns for further followup. She has had no deterioration of the wound but it has not completely healed. There is no obvious drainage. She does not complain of pain. She is demented. Her daughter was present for the office visit today.  Physical exam:  Filed Vitals:   11/11/13 1400  BP: 129/63  Pulse: 78  Height: 5\' 1"  (1.549 m)  Weight: 160 lb (72.576 kg)  SpO2: 100%    Right foot dry eschar 2 x 1 cm diameter overriding second toe amputation site no erythema no drainage no pain on palpation no fluctuance  Assessment: Slowly healing right second toe amputation  Plan: Patient will followup in 6 weeks to recheck the right foot. The patient and family have been informed that if the wound does not heal she would require an above-knee amputation. So farthe wound appears to not be deteriorating hopefully will heal over time.  Fabienne Brunsharles Sebastion Jun, MD [facility] [place] Office: [phone] Pager: [phone]

## 2013-11-17 ENCOUNTER — Telehealth: Payer: Self-pay | Admitting: *Deleted

## 2013-11-17 NOTE — Telephone Encounter (Signed)
Shelly, a nurse [facility] called requesting to change the order for daily wound care to three times weekly.   After reviewing the last office visit note by Dr Darrick PennaFields, daily cleansing and dressing changes will continue. The nurse on patients hall that I spoke to was [employer] P. At 4:30 pm.

## 2013-11-30 ENCOUNTER — Non-Acute Institutional Stay (SKILLED_NURSING_FACILITY): Payer: Medicare Other | Admitting: Internal Medicine

## 2013-11-30 ENCOUNTER — Encounter: Payer: Self-pay | Admitting: Internal Medicine

## 2013-11-30 DIAGNOSIS — I779 Disorder of arteries and arterioles, unspecified: Secondary | ICD-10-CM

## 2013-11-30 DIAGNOSIS — I743 Embolism and thrombosis of arteries of the lower extremities: Secondary | ICD-10-CM

## 2013-11-30 DIAGNOSIS — G20 Parkinson's disease: Secondary | ICD-10-CM

## 2013-11-30 DIAGNOSIS — F0280 Dementia in other diseases classified elsewhere without behavioral disturbance: Secondary | ICD-10-CM

## 2013-11-30 DIAGNOSIS — G20A1 Parkinson's disease without dyskinesia, without mention of fluctuations: Secondary | ICD-10-CM

## 2013-11-30 DIAGNOSIS — E119 Type 2 diabetes mellitus without complications: Secondary | ICD-10-CM

## 2013-11-30 DIAGNOSIS — I798 Other disorders of arteries, arterioles and capillaries in diseases classified elsewhere: Secondary | ICD-10-CM

## 2013-11-30 DIAGNOSIS — E1159 Type 2 diabetes mellitus with other circulatory complications: Secondary | ICD-10-CM

## 2013-11-30 DIAGNOSIS — R1310 Dysphagia, unspecified: Secondary | ICD-10-CM

## 2013-11-30 NOTE — Progress Notes (Signed)
Patient ID: [name], female   DOB: [birth year], 80 y.o.   MRN: [id number]  Location: [facility] Provider:  Gwenith Spitz. Renato Gails, D.O., C.M.D.  Code Status:  DNR    Chief Complaint  Patient presents with  . Medical Managment of Chronic Issues    HPI:  80 yo female long term care resident with late stage Parkinson's disease, vascular dementia, chronic venous insufficiency and PAD seen for medical mgt of chronic diseases.  She recently had a traumatic injury to her third toe during a podiatric visit that would not heal and eventually required amputation.  The amputation site still has eschar present at this time, but no drainage and pt denies pain at this time.    Review of Systems:  Review of Systems  Constitutional: Negative for fever and chills.  HENT: Negative for congestion.   Respiratory: Negative for shortness of breath.   Cardiovascular: Negative for chest pain.  Gastrointestinal: Negative for abdominal pain.  Genitourinary: Negative for dysuria.  Musculoskeletal:       Denies foot pain  Neurological: Positive for tremors.  Psychiatric/Behavioral: Positive for memory loss.    Medications: Patient's Medications  New Prescriptions   No medications on file  Previous Medications   ACETAMINOPHEN (TYLENOL) 500 MG TABLET    Take 1,000 mg by mouth 2 (two) times daily.   ALUM & MAG HYDROXIDE-SIMETH (MAALOX/MYLANTA) 200-200-20 MG/5ML SUSPENSION    Take 30 mLs by mouth every 6 (six) hours as needed for indigestion or heartburn.   AMLODIPINE (NORVASC) 5 MG TABLET    Take 5 mg by mouth daily.   CARBIDOPA-LEVODOPA (SINEMET IR) 25-100 MG PER TABLET    Take 0.5 tablets by mouth every 8 (eight) hours.    CHOLECALCIFEROL 2000 UNITS CAPS    Take 1 capsule by mouth daily.   CLONIDINE (CATAPRES) 0.1 MG TABLET    Take 0.1 mg by mouth daily as needed (sbp >180).   DORZOLAMIDE-TIMOLOL (COSOPT) 22.3-6.8 MG/ML OPHTHALMIC SOLUTION    Place 1 drop into both eyes 2 (two) times  daily.   INSULIN ASPART (NOVOLOG) 100 UNIT/ML INJECTION    Inject 5 Units into the skin 3 (three) times daily with meals. For cbg >=150 prior to meals   INSULIN DETEMIR (LEVEMIR) 100 UNIT/ML INJECTION    Inject 10 Units into the skin at bedtime.    MAGNESIUM HYDROXIDE (MILK OF MAGNESIA) 400 MG/5ML SUSPENSION    Take 30 mLs by mouth daily as needed for constipation.   MULTIPLE VITAMIN (MULTIVITAMIN) TABLET    Take 1 tablet by mouth daily.  Modified Medications   No medications on file  Discontinued Medications   No medications on file    Physical Exam: There were no vitals filed for this visit. Physical Exam  Constitutional: She appears well-developed and well-nourished. No distress.  Cardiovascular: Normal rate, regular rhythm, normal heart sounds and intact distal pulses.   Pulmonary/Chest: Effort normal and breath sounds normal. No respiratory distress.  Abdominal: Soft. Bowel sounds are normal. She exhibits no distension and no mass. There is no tenderness.  Musculoskeletal:  Tremor present bilateral UE  Neurological: She is alert.  Talkative, but difficult to understand due to dysarthria and today dry mouth  Skin: Skin is warm and dry.  Eschar over third toe amputation site  Psychiatric:  Very pleasant and smiling     Labs reviewed: Basic Metabolic Panel:  Recent Labs  40/98/11 1533 08/28/13 0526 11/05/13 0137  NA 130* 135 139  K 3.8 4.0  3.9  CL 96 100 105  CO2 26 26 23   GLUCOSE 311* 98 183*  BUN 13 10 14   CREATININE 0.65 0.55 0.58  CALCIUM 8.5 8.8 8.5    Liver Function Tests: No results found for this basename: AST, ALT, ALKPHOS, BILITOT, PROT, ALBUMIN,  in the last 8760 hours  CBC:  Recent Labs  08/27/13 1533 08/28/13 0526  08/28/13 1955 08/29/13 0555 11/05/13 0137  WBC 10.8* 9.5  --   --  8.8 10.2  NEUTROABS 8.9*  --   --   --   --  8.0*  HGB 8.3* 9.1*  < > 8.7* 8.4* 10.1*  HCT 25.4* 27.6*  < > 26.7* 26.2* 31.7*  MCV 91.4 92.3  --   --  91.6 90.6    PLT 266 279  --   --  273 286  < > = values in this interval not displayed.  Assessment/Plan 1. Parkinson's disease -later stage, tremors fairly well controlled with sinemet  2. Peripheral arterial occlusive disease -s/p amputation of third toe where she developed ulceration after traumatic toenail trimming -seems to be healing very slowly -high risk for recurrent ulcerations due to her poor circulation--avoid pressure   3. Dementia in Parkinson's disease -late stage, remains verbal, but dysarthric, dependent in adls, weight stable  4. DM type 2 causing vascular disease -controlled with levemir and novolog meal coverage  5. Dysphagia, unspecified(787.20) -cont concho nas pureed diet with nectar thickened liquids--getting large portions which may no longer be needed as she as gained a few lbs lately, also getting 2cal 60cc bid for wound healing  6. Type II or unspecified type diabetes mellitus without mention of complication, not stated as uncontrolled -cont levemir and novolog meal coverage  Family/ staff Communication:pt was seen with the unit supervisor Goals of care: DNR--palliative care

## 2013-12-02 ENCOUNTER — Telehealth: Payer: Self-pay

## 2013-12-02 NOTE — Telephone Encounter (Signed)
Phone call from SylviaShelly, Charity fundraiserN from [facility].  Reports that pt's. Right 2nd toe amp site has eschar that is "softening", and has noted some fluctuance during daily wound care.  Has continued to have site washed daily with soap/water; pt. C/o pain with daily treatment.  States there is no erythema; no drainage.  Reports continuing to apply dry dressing daily after cleansing.   Discussed with Dr. Darrick PennaFields.  Stated pt. will likely require an AKA; if pt. Is prepared to discuss amputation, move her appt. To an earlier date;  if not ready for amputation, then f/u as previously scheduled on 2/19.  Notified Shelly, Charity fundraiserN at [facility].  Informed of Dr. Darrick PennaFields recommendations.  Stated she will notify the pt's daughter.

## 2013-12-08 ENCOUNTER — Telehealth: Payer: Self-pay | Admitting: Vascular Surgery

## 2013-12-08 NOTE — Telephone Encounter (Signed)
Message copied by Fredrich BirksMILLIKAN, DANA P on Wed Dec 08, 2013  3:38 PM ------      Message from: Phillips OdorPULLINS, CAROL S      Created: Tue Dec 07, 2013  4:45 PM      Regarding: RE: Re: amputation      Contact: [phone]       Called / discussed with daughter re: pts. status.  The daughter wishes to keep the appt. 2/19, to give a full 6 wks. from last OV to see if any improvement.      ----- Message -----         From: Fredrich Birksana P [place]         Sent: 12/06/2013   1:13 PM           To: Conley Simmondsarol Sue Pullins, RN      Subject: Re: amputation                                           Okey Regalarol,            Please call patients daughter Elease Hashimotoatricia at [phone]. She really wants to discuss what Dr Darrick PennaFields had said regarding amputation. She said that no one had called her to discuss anything and she really would like to speak with a clinical representative.            Thanks,      Annabelle Harmanana       ------

## 2013-12-22 ENCOUNTER — Encounter: Payer: Self-pay | Admitting: Vascular Surgery

## 2013-12-23 ENCOUNTER — Encounter: Payer: Self-pay | Admitting: Vascular Surgery

## 2013-12-23 ENCOUNTER — Encounter: Payer: Self-pay | Admitting: Internal Medicine

## 2013-12-23 ENCOUNTER — Ambulatory Visit (INDEPENDENT_AMBULATORY_CARE_PROVIDER_SITE_OTHER): Payer: Medicare Other | Admitting: Vascular Surgery

## 2013-12-23 VITALS — BP 149/47 | HR 74 | Temp 97.7°F | Ht 61.0 in | Wt 160.0 lb

## 2013-12-23 DIAGNOSIS — I70209 Unspecified atherosclerosis of native arteries of extremities, unspecified extremity: Secondary | ICD-10-CM

## 2013-12-23 DIAGNOSIS — I739 Peripheral vascular disease, unspecified: Secondary | ICD-10-CM

## 2013-12-23 DIAGNOSIS — L98499 Non-pressure chronic ulcer of skin of other sites with unspecified severity: Secondary | ICD-10-CM

## 2013-12-23 NOTE — Progress Notes (Signed)
Patient is an 80 year old female status post amputation of her right second toe October of 2014. She returns for further followup. She has had no deterioration of the wound but it has not completely healed. There is no obvious drainage. She does not complain of pain. She is demented. Her daughter was present for the office visit today.  Physical exam:    Filed Vitals:   12/23/13 1416  BP: 149/47  Pulse: 74  Temp: 97.7 F (36.5 C)  TempSrc: Oral  Height: 5\' 1"  (1.549 m)  Weight: 160 lb (72.576 kg)  SpO2: 100%   Right foot dry eschar 2 x 1 cm diameter toe amputation site no erythema dry eschar a small amount of yellowish purulent material expressible. A dry eschar was debrided today. There is some fat necrosis below this but there was some tissue bleeding.   Assessment: Slowly healing right second toe amputation  Plan: Patient will followup in 6 weeks to recheck the right foot. The patient and family have been informed that if the wound does not heal she would require an above-knee amputation. So far the wound appears to not be deteriorating hopefully will heal over time. Normal saline wet to dry dressings right foot twice daily.  Fabienne Brunsharles Lindalou Soltis, MD [facility] [place] Office: [phone] Pager: [phone]

## 2013-12-24 ENCOUNTER — Non-Acute Institutional Stay (SKILLED_NURSING_FACILITY): Payer: Medicare Other | Admitting: Internal Medicine

## 2013-12-24 DIAGNOSIS — I739 Peripheral vascular disease, unspecified: Secondary | ICD-10-CM

## 2013-12-24 DIAGNOSIS — I798 Other disorders of arteries, arterioles and capillaries in diseases classified elsewhere: Secondary | ICD-10-CM

## 2013-12-24 DIAGNOSIS — L98499 Non-pressure chronic ulcer of skin of other sites with unspecified severity: Secondary | ICD-10-CM

## 2013-12-24 DIAGNOSIS — E1159 Type 2 diabetes mellitus with other circulatory complications: Secondary | ICD-10-CM

## 2013-12-24 NOTE — Progress Notes (Signed)
Patient ID: [name], female   DOB: December 22, 1921, 80 y.o.   MRN: [id number]    [name] [place]  Chief Complaint  Patient presents with  . Acute Visit    hyperglycemia   Allergies  Allergen Reactions  . Contrast Media [Iodinated Diagnostic Agents] Other (See Comments)    unknown  . Fish Allergy Other (See Comments)    Reaction unspecified on MAR  . Iodine Other (See Comments)    Reaction unspecified on MAR  . Shellfish Allergy Other (See Comments)    Reaction unspecified on MAR   HPI 80 yo female long term care resident with late stage Parkinson's disease, vascular dementia, chronic venous insufficiency and PAD seen for elevated blood sugar reading. She is on levemir 10 u at present. Her cbg has been in 200s recently. She denies any nausea, vomiting, abdominal pain, headache, blurry vision at present. She is compliant with her meals and medications. Her right second toe amputation site is healing slowly.   Review of Systems  Constitutional: Negative for fever and chills.  HENT: Negative for congestion.   Respiratory: Negative for shortness of breath.   Cardiovascular: Negative for chest pain.  Gastrointestinal: Negative for abdominal pain.   Past Medical History  Diagnosis Date  . Fall   . FTT (failure to thrive) in adult   . Anemia   . Diabetes mellitus without complication   . Anxiety   . Glaucoma   . Acute bronchitis 11/22/2011  . Unspecified vitamin D deficiency 08/30/2011  . Vascular dementia, uncomplicated 02/26/2011  . Unspecified late effects of cerebrovascular disease 11/18/2008  . Reflux esophagitis 11/18/2008  . Ulcer of other part of foot 11/18/2008  . Shortness of breath 11/18/2008  . Paralysis agitans 11/03/2008  . Unspecified essential hypertension 11/03/2008  . Irritable bowel syndrome 11/03/2008  . Ulcer     right second toe   Medication reviewed. See MAR  Physical exam Vitals signs are stable  Constitutional: She appears  well-developed and well-nourished. No distress.  Cardiovascular: Normal rate, regular rhythm, normal heart sounds and intact distal pulses.   Pulmonary/Chest: Effort normal and breath sounds normal. No respiratory distress.  Abdominal: Soft. Bowel sounds are normal. She exhibits no distension and no mass. There is no tenderness.  Musculoskeletal: Tremor present bilateral UE  Neurological: She is alert.  Skin: Skin is warm and dry. Eschar over second toe amputation site   Lab-  8/15 a1c 8.7  Assessment/plan  Dm type 2 with vascular complications- Recheck a1c Increase levemir to 15 u daily and continue SSI novolog for now Monitor cbg  PAD S/p right second toe amputation, healing slowly, has follow up with vascular and if fails to heal completely, she will require AKA. Monitor and continue skin care.

## 2014-01-19 ENCOUNTER — Encounter: Payer: Self-pay | Admitting: Vascular Surgery

## 2014-01-20 ENCOUNTER — Ambulatory Visit (INDEPENDENT_AMBULATORY_CARE_PROVIDER_SITE_OTHER): Payer: Medicare Other | Admitting: Vascular Surgery

## 2014-01-20 ENCOUNTER — Encounter: Payer: Self-pay | Admitting: Vascular Surgery

## 2014-01-20 VITALS — BP 125/42 | HR 110 | Ht 61.0 in | Wt 160.0 lb

## 2014-01-20 DIAGNOSIS — I739 Peripheral vascular disease, unspecified: Secondary | ICD-10-CM

## 2014-01-20 DIAGNOSIS — I70209 Unspecified atherosclerosis of native arteries of extremities, unspecified extremity: Secondary | ICD-10-CM

## 2014-01-20 DIAGNOSIS — L98499 Non-pressure chronic ulcer of skin of other sites with unspecified severity: Secondary | ICD-10-CM

## 2014-01-20 NOTE — Progress Notes (Signed)
Patient is an 80 year old female status post amputation of her right second toe October of 2014. She returns for further followup. She has had no deterioration of the wound but it has not completely healed. There is no obvious drainage. She does not complain of pain. She is demented. Her daughter was present for the office visit today.  Physical exam:     Filed Vitals:   01/20/14 1411  BP: 125/42  Pulse: 110  Height: 5\' 1"  (1.549 m)  Weight: 160 lb (72.576 kg)  SpO2: 100%   Right foot dry eschar 1.5 x 1.5 cm diameter toe amputation site no erythema dry eschar  There is some fat necrosis below this but there was some tissue bleeding. Wound is overall more superficial  Assessment: Slowly healing right second toe amputation  Plan: Patient will followup in 4 weeks to recheck the right foot. The patient and family have been informed that if the wound does not heal she would require an above-knee amputation. So far the wound appears to not be deteriorating hopefully will heal over time. Normal saline wet to dry dressings right foot twice daily.  Fabienne Brunsharles Huyen Perazzo, MD [facility] [place] Office: [phone] Pager: [phone]

## 2014-02-03 ENCOUNTER — Telehealth: Payer: Self-pay

## 2014-02-03 NOTE — Telephone Encounter (Signed)
Rec'd phone call from Treatment nurse at [facility] re: (R) 2nd toe amp site.  Reported the eschar is lifting up, and beneath eschar is soft tissue with yellow drainage, and odor present.  Reported the current order is to do wet-dry dressing to site.  Asking about order for Santyl oint for debridement.  Discussed w/ Dr. Darrick PennaFields.  Rec'd order for Santyl oint. application to the wound (R) 2nd toe amp. site daily.  Notified nurse at [facility] with updated wound care order.  Verb. understanding.

## 2014-02-16 ENCOUNTER — Encounter: Payer: Self-pay | Admitting: Vascular Surgery

## 2014-02-17 ENCOUNTER — Ambulatory Visit (INDEPENDENT_AMBULATORY_CARE_PROVIDER_SITE_OTHER): Payer: Medicare Other | Admitting: Vascular Surgery

## 2014-02-17 ENCOUNTER — Encounter: Payer: Self-pay | Admitting: Vascular Surgery

## 2014-02-17 VITALS — BP 128/50 | HR 79 | Temp 97.3°F | Ht 61.0 in | Wt 160.0 lb

## 2014-02-17 DIAGNOSIS — L98499 Non-pressure chronic ulcer of skin of other sites with unspecified severity: Secondary | ICD-10-CM

## 2014-02-17 DIAGNOSIS — I70209 Unspecified atherosclerosis of native arteries of extremities, unspecified extremity: Secondary | ICD-10-CM

## 2014-02-17 DIAGNOSIS — I739 Peripheral vascular disease, unspecified: Secondary | ICD-10-CM

## 2014-02-17 NOTE — Progress Notes (Signed)
Patient is an 80 year old female status post amputation of her right second toe October of 2014. She returns for further followup. She has had no deterioration of the wound but it has not completely healed. There is no obvious drainage. She does not complain of pain. She is demented. Her daughter was present for the office visit today.  Physical exam:  Filed Vitals:   02/17/14 0923  BP: 128/50  Pulse: 79  Temp: 97.3 F (36.3 C)  TempSrc: Oral  Height: 5\' 1"  (1.549 m)  Weight: 160 lb (72.576 kg)  SpO2: 100%        Right foot dry eschar 1 x 1 cm diameter toe amputation site no erythema dry eschar   Wound is overall more superficial  Assessment: Slowly healing right second toe amputation  Plan: The patient and family have been informed that if the wound does not heal she would require an above-knee amputation. So far the wound appears to not be deteriorating hopefully will heal over time. Followup when necessary  Fabienne Brunsharles Josalin Carneiro, MD [facility] [place] Office: [phone] Pager: [phone]

## 2014-03-14 ENCOUNTER — Emergency Department (HOSPITAL_COMMUNITY)
Admission: EM | Admit: 2014-03-14 | Discharge: 2014-03-14 | Disposition: A | Discharge status: Home / Self Care | Payer: Medicare Other | Attending: Emergency Medicine | Admitting: Emergency Medicine

## 2014-03-14 ENCOUNTER — Emergency Department (HOSPITAL_COMMUNITY): Payer: Medicare Other

## 2014-03-14 DIAGNOSIS — W050XXA Fall from non-moving wheelchair, initial encounter: Secondary | ICD-10-CM | POA: Insufficient documentation

## 2014-03-14 DIAGNOSIS — I6990 Unspecified sequelae of unspecified cerebrovascular disease: Secondary | ICD-10-CM | POA: Insufficient documentation

## 2014-03-14 DIAGNOSIS — E119 Type 2 diabetes mellitus without complications: Secondary | ICD-10-CM | POA: Insufficient documentation

## 2014-03-14 DIAGNOSIS — N390 Urinary tract infection, site not specified: Secondary | ICD-10-CM | POA: Insufficient documentation

## 2014-03-14 DIAGNOSIS — Z862 Personal history of diseases of the blood and blood-forming organs and certain disorders involving the immune mechanism: Secondary | ICD-10-CM | POA: Insufficient documentation

## 2014-03-14 DIAGNOSIS — F411 Generalized anxiety disorder: Secondary | ICD-10-CM | POA: Insufficient documentation

## 2014-03-14 DIAGNOSIS — Z872 Personal history of diseases of the skin and subcutaneous tissue: Secondary | ICD-10-CM | POA: Insufficient documentation

## 2014-03-14 DIAGNOSIS — Y939 Activity, unspecified: Secondary | ICD-10-CM | POA: Insufficient documentation

## 2014-03-14 DIAGNOSIS — W19XXXA Unspecified fall, initial encounter: Secondary | ICD-10-CM

## 2014-03-14 DIAGNOSIS — F0150 Vascular dementia without behavioral disturbance: Secondary | ICD-10-CM | POA: Insufficient documentation

## 2014-03-14 DIAGNOSIS — S0100XA Unspecified open wound of scalp, initial encounter: Secondary | ICD-10-CM | POA: Insufficient documentation

## 2014-03-14 DIAGNOSIS — Z79899 Other long term (current) drug therapy: Secondary | ICD-10-CM | POA: Insufficient documentation

## 2014-03-14 DIAGNOSIS — S0101XA Laceration without foreign body of scalp, initial encounter: Secondary | ICD-10-CM

## 2014-03-14 DIAGNOSIS — Z794 Long term (current) use of insulin: Secondary | ICD-10-CM | POA: Insufficient documentation

## 2014-03-14 DIAGNOSIS — G20A1 Parkinson's disease without dyskinesia, without mention of fluctuations: Secondary | ICD-10-CM | POA: Insufficient documentation

## 2014-03-14 DIAGNOSIS — Y929 Unspecified place or not applicable: Secondary | ICD-10-CM | POA: Insufficient documentation

## 2014-03-14 DIAGNOSIS — E559 Vitamin D deficiency, unspecified: Secondary | ICD-10-CM | POA: Insufficient documentation

## 2014-03-14 DIAGNOSIS — Z8709 Personal history of other diseases of the respiratory system: Secondary | ICD-10-CM | POA: Insufficient documentation

## 2014-03-14 DIAGNOSIS — I10 Essential (primary) hypertension: Secondary | ICD-10-CM | POA: Insufficient documentation

## 2014-03-14 DIAGNOSIS — H409 Unspecified glaucoma: Secondary | ICD-10-CM | POA: Insufficient documentation

## 2014-03-14 DIAGNOSIS — G20 Parkinson's disease: Secondary | ICD-10-CM | POA: Insufficient documentation

## 2014-03-14 DIAGNOSIS — K589 Irritable bowel syndrome without diarrhea: Secondary | ICD-10-CM | POA: Insufficient documentation

## 2014-03-14 LAB — URINALYSIS, ROUTINE W REFLEX MICROSCOPIC
Bilirubin Urine: NEGATIVE
Glucose, UA: NEGATIVE mg/dL
KETONES UR: NEGATIVE mg/dL
Nitrite: NEGATIVE
Protein, ur: NEGATIVE mg/dL
Specific Gravity, Urine: 1.024 (ref 1.005–1.030)
Urobilinogen, UA: 0.2 mg/dL (ref 0.0–1.0)
pH: 6 (ref 5.0–8.0)

## 2014-03-14 LAB — URINE MICROSCOPIC-ADD ON

## 2014-03-14 LAB — CBG MONITORING, ED: Glucose-Capillary: 84 mg/dL (ref 70–99)

## 2014-03-14 MED ORDER — CEPHALEXIN 500 MG PO CAPS: 500.0000 mg | ORAL_CAPSULE | Freq: Four times a day (QID) | ORAL | Status: DC

## 2014-03-14 NOTE — ED Provider Notes (Signed)
LACERATION REPAIR Performed by: Dorthula Matasiffany G Braedon Sjogren Authorized by: Dorthula Matasiffany G Arlynn Mcdermid Consent: Verbal consent obtained. Risks and benefits: risks, benefits and alternatives were discussed Consent given by: patient Patient identity confirmed: provided demographic data Prepped and Draped in normal sterile fashion Wound explored  Laceration Location: mid forehead  Laceration Length: 3 cm  No Foreign Bodies seen or palpated  Anesthesia: local infiltration  Local anesthetic: lidocaine 2 % with epinephrine  Anesthetic total: 4 ml  Irrigation method: syringe Amount of cleaning: standard  Skin closure: sutures  Number of sutures: 6   Technique: simple interrupted  Patient tolerance: Patient tolerated the procedure well with no immediate complications.  For HPI, ROS, PE, DISPO please refer to Dr. Salena Saner. Horton's note from today.    Dorthula Matasiffany G Zion Ta, PA-C 03/14/14 1407

## 2014-03-14 NOTE — ED Provider Notes (Signed)
Medical screening examination/treatment/procedure(s) were performed by non-physician practitioner and as supervising physician I was immediately available for consultation/collaboration.   EKG Interpretation   Date/Time:  Monday Mar 14 2014 12:23:21 EDT Ventricular Rate:  82 PR Interval:  134 QRS Duration: 90 QT Interval:  387 QTC Calculation: 452 R Axis:   34 Text Interpretation:  Sinus rhythm Low voltage, precordial leads Abnormal  R-wave progression, early transition Nonspecific T abnrm, anterolateral  leads Confirmed by Wilkie AyeHORTON  MD, Tildon Silveria ([id number]) on 03/14/2014 12:42:36 PM        Shon Batonourtney F Wendolyn Raso, MD 03/14/14 1421

## 2014-03-14 NOTE — ED Provider Notes (Signed)
CSN: [id number]     Arrival date & time 03/14/14  1157 History   First MD Initiated Contact with Patient 03/14/14 1158     Chief Complaint  Patient presents with  . Fall     (Consider location/radiation/quality/duration/timing/severity/associated sxs/prior Treatment) HPI  This is an 80 year old female with history of dementia, diabetes who presents following a fall. Per EMS, patient fell from her wheelchair onto a wooden floor echo living. Unknown if this was a mechanical fall. Patient did not lose consciousness per report. At baseline she is oriented only to person and is noncontributory to history taking. She does deny any pain. She was moves all 4 extremities. No known aspirin, Plavix, or Coumadin use.  Level V caveat  Past Medical History  Diagnosis Date  . Fall   . FTT (failure to thrive) in adult   . Anemia   . Diabetes mellitus without complication   . Anxiety   . Glaucoma   . Acute bronchitis 11/22/2011  . Unspecified vitamin D deficiency 08/30/2011  . Vascular dementia, uncomplicated 02/26/2011  . Unspecified late effects of cerebrovascular disease 11/18/2008  . Reflux esophagitis 11/18/2008  . Ulcer of other part of foot 11/18/2008  . Shortness of breath 11/18/2008  . Paralysis agitans 11/03/2008  . Unspecified essential hypertension 11/03/2008  . Irritable bowel syndrome 11/03/2008  . Ulcer     right second toe   Past Surgical History  Procedure Laterality Date  . Amputation Right 08/25/2013    Procedure: AMPUTATION OF RIGHT 2ND TOE;  Surgeon: Sherren Kernsharles E Fields, MD;  [facility];  Service: Vascular;  Laterality: Right;   Family History  Problem Relation Age of Onset  . Stroke Mother   . Diabetes Mother   . Diabetes Sister      x 2  . Hypertension Mother   . Lung cancer Father    History  Substance Use Topics  . Smoking status: Never Smoker   . Smokeless tobacco: Never Used  . Alcohol Use: No   OB History   Grav Para Term Preterm Abortions TAB  SAB Ect Mult Living                 Review of Systems  Unable to perform ROS: Dementia      Allergies  Contrast media; Fish allergy; Iodine; and Shellfish allergy  Home Medications   Prior to Admission medications   Medication Sig Start Date End Date Taking? Authorizing Provider  acetaminophen (TYLENOL) 500 MG tablet Take 1,000 mg by mouth 2 (two) times daily.    Historical Provider, MD  alum & mag hydroxide-simeth (MAALOX/MYLANTA) 200-200-20 MG/5ML suspension Take 30 mLs by mouth every 6 (six) hours as needed for indigestion or heartburn.    Historical Provider, MD  amLODipine (NORVASC) 5 MG tablet Take 5 mg by mouth daily.    Historical Provider, MD  carbidopa-levodopa (SINEMET IR) 25-100 MG per tablet Take 0.5 tablets by mouth every 8 (eight) hours.     Historical Provider, MD  Cholecalciferol 2000 UNITS CAPS Take 1 capsule by mouth daily.    Historical Provider, MD  cloNIDine (CATAPRES) 0.1 MG tablet Take 0.1 mg by mouth daily as needed (sbp >180).    Historical Provider, MD  dorzolamide-timolol (COSOPT) 22.3-6.8 MG/ML ophthalmic solution Place 1 drop into both eyes 2 (two) times daily.    Historical Provider, MD  insulin aspart (NOVOLOG) 100 UNIT/ML injection Inject 5 Units into the skin 3 (three) times daily with meals. For cbg >=150 prior to  meals    Historical Provider, MD  insulin detemir (LEVEMIR) 100 UNIT/ML injection Inject 10 Units into the skin at bedtime.     Historical Provider, MD  magnesium hydroxide (MILK OF MAGNESIA) 400 MG/5ML suspension Take 30 mLs by mouth daily as needed for constipation.    Historical Provider, MD  Multiple Vitamin (MULTIVITAMIN) tablet Take 1 tablet by mouth daily.    Historical Provider, MD   BP 121/43  Pulse 82  Temp(Src) 97.6 F (36.4 C) (Oral)  Resp 18  SpO2 100% Physical Exam  Nursing note and vitals reviewed. Constitutional: No distress.  elderly  HENT:  Head: Normocephalic.  Mouth/Throat: Oropharynx is clear and moist.  3 cm  vertical laceration in the center of the 4 head, Steri-Strips in place, edentulous  Eyes: Pupils are equal, round, and reactive to light.  Neck: Normal range of motion. Neck supple.  Cardiovascular: Normal rate, regular rhythm and normal heart sounds.   No murmur heard. Pulmonary/Chest: Effort normal and breath sounds normal. No respiratory distress. She has no wheezes.  Abdominal: Soft. There is no tenderness.  Musculoskeletal:  Range of motion normal in the bilateral hips and knees without evidence of pain, no obvious deformity  Neurological: She is alert.  Oriented to person only, moves all 4 extremities and follows simple commands  Skin: Skin is warm and dry.  Laceration as noted above  Psychiatric: She has a normal mood and affect.    ED Course  Procedures (including critical care time) Labs Review Labs Reviewed  URINALYSIS, ROUTINE W REFLEX MICROSCOPIC - Abnormal; Notable for the following:    APPearance HAZY (*)    Hgb urine dipstick SMALL (*)    Leukocytes, UA MODERATE (*)    All other components within normal limits  URINE MICROSCOPIC-ADD ON - Abnormal; Notable for the following:    Squamous Epithelial / LPF MANY (*)    Bacteria, UA FEW (*)    All other components within normal limits  URINE CULTURE  CBG MONITORING, ED    Imaging Review Ct Head Wo Contrast  03/14/2014   CLINICAL DATA:  Fall from wheelchair onto wooden floor  EXAM: CT HEAD WITHOUT CONTRAST  CT CERVICAL SPINE WITHOUT CONTRAST  TECHNIQUE: Multidetector CT imaging of the head and cervical spine was performed following the standard protocol without intravenous contrast. Multiplanar CT image reconstructions of the cervical spine were also generated.  COMPARISON:  Prior head CT 01/17/2011  FINDINGS: CT HEAD FINDINGS  Negative for acute intracranial hemorrhage, acute infarction, mass, mass effect, hydrocephalus or midline shift. Gray-white differentiation is preserved throughout. Stable global cerebral loss  consistent with atrophy. Periventricular white matter hypoattenuation remains consistent with the sequelae of longstanding microvascular ischemic white matter disease. No significant interval progression. Bilateral basal ganglia calcifications noted incidentally. Calcifications of the bilateral cavernous carotid arteries and intracranial vertebral arteries. Globes and orbits are intact and symmetric bilaterally. Subcutaneous emphysema of the soft tissues overlying the frontal bone consistent with the clinical history of laceration. No large scalp hematoma. No underlying calvarial fracture. Normal aeration of the mastoid air cells and visualized paranasal sinuses.  CT CERVICAL SPINE FINDINGS  No acute fracture, malalignment or prevertebral soft tissue swelling. Multilevel cervical spondylosis. Spondylitic changes are most significant at C5-C6. No significant listhesis. Mild multilevel facet arthropathy. No lytic or blastic osseous lesion. Partial calcification of the ligamentum flavum. Unremarkable CT appearance of the thyroid gland. No acute soft tissue abnormality. The lung apices are unremarkable.  IMPRESSION: CT HEAD  1. No acute  intracranial abnormality. 2. Stable atrophy and chronic microvascular ischemic white matter changes without significant interval progression. 3. Forehead laceration without significant associated scalp hematoma or underlying calvarial fracture. 4. Intracranial atherosclerosis. CT CSPINE  1. No acute fracture or malalignment. 2. Multilevel degenerative spondylosis and facet arthropathy.   Electronically Signed   By: Malachy Moan M.D.   On: 03/14/2014 14:04   Ct Cervical Spine Wo Contrast  03/14/2014   CLINICAL DATA:  Fall from wheelchair onto wooden floor  EXAM: CT HEAD WITHOUT CONTRAST  CT CERVICAL SPINE WITHOUT CONTRAST  TECHNIQUE: Multidetector CT imaging of the head and cervical spine was performed following the standard protocol without intravenous contrast. Multiplanar CT  image reconstructions of the cervical spine were also generated.  COMPARISON:  Prior head CT 01/17/2011  FINDINGS: CT HEAD FINDINGS  Negative for acute intracranial hemorrhage, acute infarction, mass, mass effect, hydrocephalus or midline shift. Gray-white differentiation is preserved throughout. Stable global cerebral loss consistent with atrophy. Periventricular white matter hypoattenuation remains consistent with the sequelae of longstanding microvascular ischemic white matter disease. No significant interval progression. Bilateral basal ganglia calcifications noted incidentally. Calcifications of the bilateral cavernous carotid arteries and intracranial vertebral arteries. Globes and orbits are intact and symmetric bilaterally. Subcutaneous emphysema of the soft tissues overlying the frontal bone consistent with the clinical history of laceration. No large scalp hematoma. No underlying calvarial fracture. Normal aeration of the mastoid air cells and visualized paranasal sinuses.  CT CERVICAL SPINE FINDINGS  No acute fracture, malalignment or prevertebral soft tissue swelling. Multilevel cervical spondylosis. Spondylitic changes are most significant at C5-C6. No significant listhesis. Mild multilevel facet arthropathy. No lytic or blastic osseous lesion. Partial calcification of the ligamentum flavum. Unremarkable CT appearance of the thyroid gland. No acute soft tissue abnormality. The lung apices are unremarkable.  IMPRESSION: CT HEAD  1. No acute intracranial abnormality. 2. Stable atrophy and chronic microvascular ischemic white matter changes without significant interval progression. 3. Forehead laceration without significant associated scalp hematoma or underlying calvarial fracture. 4. Intracranial atherosclerosis. CT CSPINE  1. No acute fracture or malalignment. 2. Multilevel degenerative spondylosis and facet arthropathy.   Electronically Signed   By: Malachy Moan M.D.   On: 03/14/2014 14:04      EKG Interpretation   Date/Time:  Monday Mar 14 2014 12:23:21 EDT Ventricular Rate:  82 PR Interval:  134 QRS Duration: 90 QT Interval:  387 QTC Calculation: 452 R Axis:   34 Text Interpretation:  Sinus rhythm Low voltage, precordial leads Abnormal  R-wave progression, early transition Nonspecific T abnrm, anterolateral  leads Confirmed by HORTON  MD, COURTNEY ([id number]) on 03/14/2014 12:42:36 PM      MDM   Final diagnoses:  Fall  Scalp laceration  UTI (urinary tract infection)    Patient presents following a fall. Unclear if this is mechanical. History of dementia and only oriented to person. EKG is nonischemic without evidence of arrhythmia. CT head and neck reassuring. Patient does have a laceration that was repaired at the bedside by Marlon Pel, PA.  Urinalysis shows 21-50 white cells and few bacteria. This was sent for culture. Will cover for urinary tract infection. Patient shows no other signs of trauma and has good movement in all 4 extremities. Will be discharged back to the living facility.  After history, exam, and medical workup I feel the patient has been appropriately medically screened and is safe for discharge home. Pertinent diagnoses were discussed with the patient. Patient was given return precautions.  Shon Batonourtney F Horton, MD 03/14/14 1420

## 2014-03-14 NOTE — Discharge Instructions (Signed)
Laceration Care, Adult °A laceration is a cut or lesion that goes through all layers of the skin and into the tissue just beneath the skin. °TREATMENT  °Some lacerations may not require closure. Some lacerations may not be able to be closed due to an increased risk of infection. It is important to see your caregiver as soon as possible after an injury to minimize the risk of infection and maximize the opportunity for successful closure. °If closure is appropriate, pain medicines may be given, if needed. The wound will be cleaned to help prevent infection. Your caregiver will use stitches (sutures), staples, wound glue (adhesive), or skin adhesive strips to repair the laceration. These tools bring the skin edges together to allow for faster healing and a better cosmetic outcome. However, all wounds will heal with a scar. Once the wound has healed, scarring can be minimized by covering the wound with sunscreen during the day for 1 full year. °HOME CARE INSTRUCTIONS  °For sutures or staples: °· Keep the wound clean and dry. °· If you were given a bandage (dressing), you should change it at least once a day. Also, change the dressing if it becomes wet or dirty, or as directed by your caregiver. °· Wash the wound with soap and water 2 times a day. Rinse the wound off with water to remove all soap. Pat the wound dry with a clean towel. °· After cleaning, apply a thin layer of the antibiotic ointment as recommended by your caregiver. This will help prevent infection and keep the dressing from sticking. °· You may shower as usual after the first 24 hours. Do not soak the wound in water until the sutures are removed. °· Only take over-the-counter or prescription medicines for pain, discomfort, or fever as directed by your caregiver. °· Get your sutures or staples removed as directed by your caregiver. °For skin adhesive strips: °· Keep the wound clean and dry. °· Do not get the skin adhesive strips wet. You may bathe  carefully, using caution to keep the wound dry. °· If the wound gets wet, pat it dry with a clean towel. °· Skin adhesive strips will fall off on their own. You may trim the strips as the wound heals. Do not remove skin adhesive strips that are still stuck to the wound. They will fall off in time. °For wound adhesive: °· You may briefly wet your wound in the shower or bath. Do not soak or scrub the wound. Do not swim. Avoid periods of heavy perspiration until the skin adhesive has fallen off on its own. After showering or bathing, gently pat the wound dry with a clean towel. °· Do not apply liquid medicine, cream medicine, or ointment medicine to your wound while the skin adhesive is in place. This may loosen the film before your wound is healed. °· If a dressing is placed over the wound, be careful not to apply tape directly over the skin adhesive. This may cause the adhesive to be pulled off before the wound is healed. °· Avoid prolonged exposure to sunlight or tanning lamps while the skin adhesive is in place. Exposure to ultraviolet light in the first year will darken the scar. °· The skin adhesive will usually remain in place for 5 to 10 days, then naturally fall off the skin. Do not pick at the adhesive film. °You may need a tetanus shot if: °· You cannot remember when you had your last tetanus shot. °· You have never had a tetanus   shot. °If you get a tetanus shot, your arm may swell, get red, and feel warm to the touch. This is common and not a problem. If you need a tetanus shot and you choose not to have one, there is a rare chance of getting tetanus. Sickness from tetanus can be serious. °SEEK MEDICAL CARE IF:  °· You have redness, swelling, or increasing pain in the wound. °· You see a red line that goes away from the wound. °· You have yellowish-white fluid (pus) coming from the wound. °· You have a fever. °· You notice a bad smell coming from the wound or dressing. °· Your wound breaks open before or  after sutures have been removed. °· You notice something coming out of the wound such as wood or glass. °· Your wound is on your hand or foot and you cannot move a finger or toe. °SEEK IMMEDIATE MEDICAL CARE IF:  °· Your pain is not controlled with prescribed medicine. °· You have severe swelling around the wound causing pain and numbness or a change in color in your arm, hand, leg, or foot. °· Your wound splits open and starts bleeding. °· You have worsening numbness, weakness, or loss of function of any joint around or beyond the wound. °· You develop painful lumps near the wound or on the skin anywhere on your body. °MAKE SURE YOU:  °· Understand these instructions. °· Will watch your condition. °· Will get help right away if you are not doing well or get worse. °Document Released: 10/21/2005 Document Revised: 01/13/2012 Document Reviewed: 04/16/2011 °Exit[employer]® Patient Information ©2014 [employer]. ° °Urinary Tract Infection °Urinary tract infections (UTIs) can develop anywhere along your urinary tract. Your urinary tract is your body's drainage system for removing wastes and extra water. Your urinary tract includes two kidneys, two ureters, a bladder, and a urethra. Your kidneys are a pair of bean-shaped organs. Each kidney is about the size of your fist. They are located below your ribs, one on each side of your spine. °CAUSES °Infections are caused by microbes, which are microscopic organisms, including fungi, viruses, and bacteria. These organisms are so small that they can only be seen through a microscope. Bacteria are the microbes that most commonly cause UTIs. °SYMPTOMS  °Symptoms of UTIs may vary by age and gender of the patient and by the location of the infection. Symptoms in young women typically include a frequent and intense urge to urinate and a painful, burning feeling in the bladder or urethra during urination. Older women and men are more likely to be tired, shaky, and weak and have muscle  aches and abdominal pain. A fever may mean the infection is in your kidneys. Other symptoms of a kidney infection include pain in your back or sides below the ribs, nausea, and vomiting. °DIAGNOSIS °To diagnose a UTI, your caregiver will ask you about your symptoms. Your caregiver also will ask to provide a urine sample. The urine sample will be tested for bacteria and white blood cells. White blood cells are made by your body to help fight infection. °TREATMENT  °Typically, UTIs can be treated with medication. Because most UTIs are caused by a bacterial infection, they usually can be treated with the use of antibiotics. The choice of antibiotic and length of treatment depend on your symptoms and the type of bacteria causing your infection. °HOME CARE INSTRUCTIONS °· If you were prescribed antibiotics, take them exactly as your caregiver instructs you. Finish the medication even if you   feel better after you have only taken some of the medication. °· Drink enough water and fluids to keep your urine clear or pale yellow. °· Avoid caffeine, tea, and carbonated beverages. They tend to irritate your bladder. °· Empty your bladder often. Avoid holding urine for long periods of time. °· Empty your bladder before and after sexual intercourse. °· After a bowel movement, women should cleanse from front to back. Use each tissue only once. °SEEK MEDICAL CARE IF:  °· You have back pain. °· You develop a fever. °· Your symptoms do not begin to resolve within 3 days. °SEEK IMMEDIATE MEDICAL CARE IF:  °· You have severe back pain or lower abdominal pain. °· You develop chills. °· You have nausea or vomiting. °· You have continued burning or discomfort with urination. °MAKE SURE YOU:  °· Understand these instructions. °· Will watch your condition. °· Will get help right away if you are not doing well or get worse. °Document Released: 07/31/2005 Document Revised: 04/21/2012 Document Reviewed: 11/29/2011 °ExitCare® Patient Information  ©2014 [employer]. ° ° °

## 2014-03-14 NOTE — ED Notes (Signed)
Pt in from [facility] via [facility], per report pt fell from wheelchair onto wooden floor, pt denies LOC, baseline alert to person, hx of dementia, lac to middle of forehead, steri strips placed by facility, denies blood thinner use, moves all extremities, upon arrival no C collar or LSB present, denies any other complaints

## 2014-03-14 NOTE — ED Notes (Signed)
Family arrived and reports that pt is slightly more confused from baseline. They state she can normally carry on a decent conversation but is not able to right now and that she is normally only oriented to person. Pt resting comfortably. Family at bedside.

## 2014-03-14 NOTE — ED Notes (Signed)
PA at bedside suturing pt head.

## 2014-03-14 NOTE — ED Notes (Signed)
PTAR paged. 

## 2014-03-15 LAB — URINE CULTURE
Colony Count: NO GROWTH
Culture: NO GROWTH
SPECIAL REQUESTS: NORMAL

## 2014-03-15 IMAGING — CR DG CHEST 1V PORT
1 series · 1 of 1 positions shown · non-contrast
Comparison: 01/19/2011

CLINICAL DATA: Hypoglycemia, unresponsive. Hypertension, diabetes

EXAM:
PORTABLE CHEST - 1 VIEW

[AP]
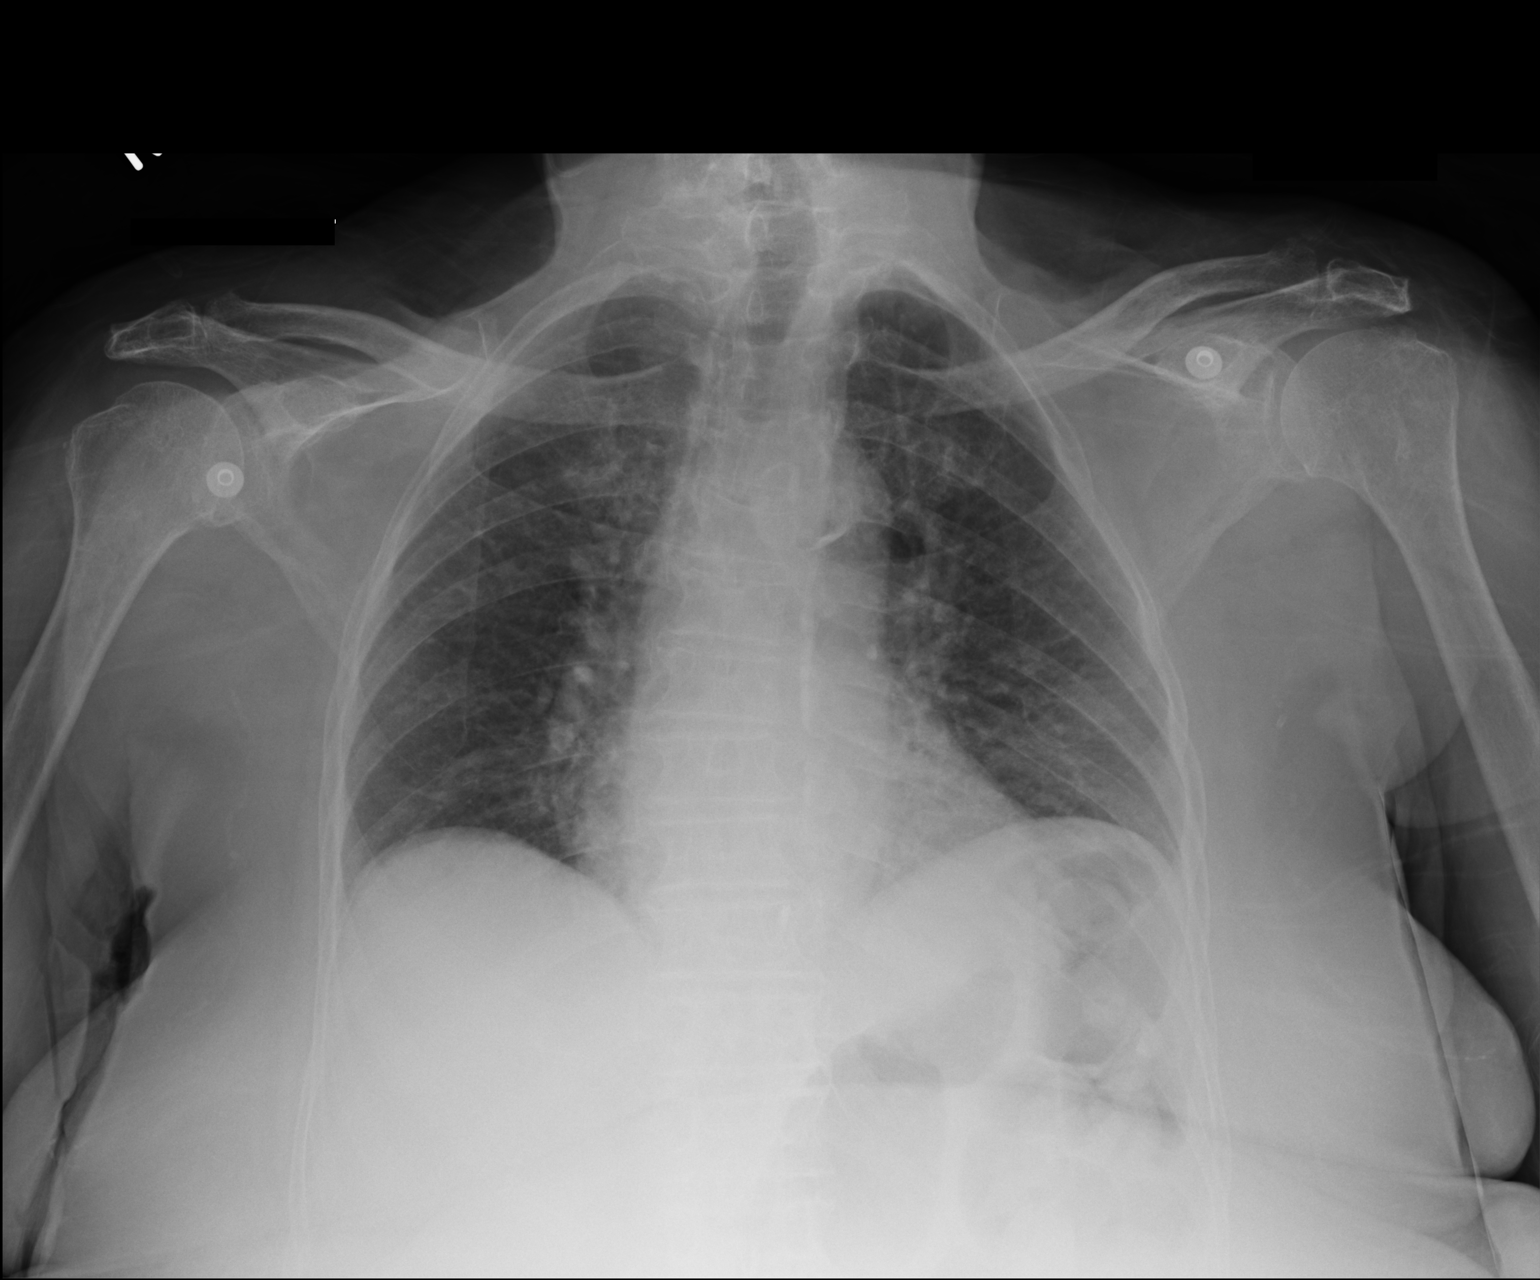

[1 of 1 positions shown; findings below may reference images not displayed]

FINDINGS: The heart size and mediastinal contours are within normal limits.
Both lungs are clear. The visualized skeletal structures are
unremarkable.
IMPRESSION: No active disease.

## 2014-03-22 ENCOUNTER — Non-Acute Institutional Stay (SKILLED_NURSING_FACILITY): Payer: Medicare Other | Admitting: Internal Medicine

## 2014-03-22 DIAGNOSIS — I739 Peripheral vascular disease, unspecified: Secondary | ICD-10-CM

## 2014-03-22 DIAGNOSIS — G20 Parkinson's disease: Secondary | ICD-10-CM

## 2014-03-22 DIAGNOSIS — L98499 Non-pressure chronic ulcer of skin of other sites with unspecified severity: Secondary | ICD-10-CM

## 2014-03-22 DIAGNOSIS — H409 Unspecified glaucoma: Secondary | ICD-10-CM

## 2014-03-22 DIAGNOSIS — I798 Other disorders of arteries, arterioles and capillaries in diseases classified elsewhere: Secondary | ICD-10-CM

## 2014-03-22 DIAGNOSIS — I70209 Unspecified atherosclerosis of native arteries of extremities, unspecified extremity: Secondary | ICD-10-CM

## 2014-03-22 DIAGNOSIS — G20A1 Parkinson's disease without dyskinesia, without mention of fluctuations: Secondary | ICD-10-CM

## 2014-03-22 DIAGNOSIS — F0280 Dementia in other diseases classified elsewhere without behavioral disturbance: Secondary | ICD-10-CM

## 2014-03-22 DIAGNOSIS — K219 Gastro-esophageal reflux disease without esophagitis: Secondary | ICD-10-CM

## 2014-03-22 DIAGNOSIS — E1159 Type 2 diabetes mellitus with other circulatory complications: Secondary | ICD-10-CM

## 2014-03-22 NOTE — Progress Notes (Signed)
Patient ID: [name], female   DOB: May 01, 1922, 80 y.o.   MRN: [id number]  [facility]: [facility] Provider:  Gwenith Spitziffany L. Renato Gailseed, D.O., C.M.D.  Code Status:  DNR  Chief Complaint  Patient presents with  . Medical Management of Chronic Issues    HPI:  80 yo black female long term care resident with Parkinson's disease with dementia, DMII on insulin, htn seen for med mgt of chronic diseases.  She does not speak very clearly, but does respond to some basic questions. Review of Systems:  Review of Systems  Constitutional: Negative for fever.  Respiratory: Negative for shortness of breath.   Cardiovascular: Negative for chest pain.  Gastrointestinal: Positive for constipation. Negative for abdominal pain.  Musculoskeletal: Negative for back pain and joint pain.  Skin: Negative for rash.  Neurological: Positive for tremors. Negative for dizziness, weakness and headaches.  Psychiatric/Behavioral: Positive for memory loss.    Medications: Patient's Medications  New Prescriptions   No medications on file  Previous Medications   ACETAMINOPHEN (TYLENOL) 500 MG TABLET    Take 1,000 mg by mouth 2 (two) times daily.   ALUM & MAG HYDROXIDE-SIMETH (MAALOX/MYLANTA) 200-200-20 MG/5ML SUSPENSION    Take 30 mLs by mouth every 6 (six) hours as needed for indigestion or heartburn.   AMLODIPINE (NORVASC) 5 MG TABLET    Take 5 mg by mouth daily.   CARBIDOPA-LEVODOPA (SINEMET IR) 25-100 MG PER TABLET    Take 0.5 tablets by mouth 3 (three) times daily.    CHOLECALCIFEROL 2000 UNITS CAPS    Take 1 capsule by mouth daily.   CLONIDINE (CATAPRES) 0.1 MG TABLET    Take 0.1 mg by mouth daily as needed (sbp >180).   DORZOLAMIDE-TIMOLOL (COSOPT) 22.3-6.8 MG/ML OPHTHALMIC SOLUTION    Place 1 drop into both eyes 2 (two) times daily.   HYDROCORTISONE CREAM 1 %    Apply 1 application topically 4 (four) times daily. Right shoulder   INSULIN ASPART (NOVOLOG) 100 UNIT/ML INJECTION    Inject 5 Units  into the skin 3 (three) times daily with meals. For cbg >=150 prior to meals   INSULIN DETEMIR (LEVEMIR) 100 UNIT/ML INJECTION    Inject 12 Units into the skin at bedtime.    MAGNESIUM HYDROXIDE (MILK OF MAGNESIA) 400 MG/5ML SUSPENSION    Take 30 mLs by mouth daily as needed for mild constipation.   MULTIPLE VITAMIN (MULTIVITAMIN) TABLET    Take 1 tablet by mouth daily.   PROMETHAZINE (PHENERGAN) 25 MG TABLET    Take 25 mg by mouth every 6 (six) hours as needed for nausea or vomiting.   SILVER (AQUACEL AG FOAM EX)    Apply 1 application topically 3 (three) times a week. To right foot   TRAZODONE (DESYREL) 25 MG TABS TABLET    Take 25 mg by mouth at bedtime.  Modified Medications   No medications on file  Discontinued Medications   CEPHALEXIN (KEFLEX) 500 MG CAPSULE    Take 1 capsule (500 mg total) by mouth 4 (four) times daily.    Physical Exam: Filed Vitals:   10/01/14 1449  BP: 117/59  Pulse: 76  Temp: 97 F (36.1 C)  Resp: 20  Height: 5\' 4"  (1.626 m)  Weight: 151 lb (68.493 kg)   Physical Exam  Constitutional: She appears well-developed and well-nourished. No distress.  Cardiovascular: Normal rate, regular rhythm, normal heart sounds and intact distal pulses.   Pulmonary/Chest: Effort normal and breath sounds normal.  Abdominal: Soft. Bowel sounds  are normal. She exhibits no distension and no mass. There is no tenderness.  Neurological: She is alert. She exhibits abnormal muscle tone.  Resting tremor present;  Speech dysarthric and hypophonic  Skin: Skin is warm and dry.  Psychiatric: She has a normal mood and affect.     Labs reviewed: Basic Metabolic Panel:  Recent Labs  56/21/2999/12/19 0137  NA 139  K 3.9  CL 105  CO2 23  GLUCOSE 183*  BUN 14  CREATININE 0.58  CALCIUM 8.5   CBC:  Recent Labs  11/05/13 0137  WBC 10.2  NEUTROABS 8.0*  HGB 10.1*  HCT 31.7*  MCV 90.6  PLT 286   Assessment/Plan 1. Parkinson's disease -cont sinemet, tremors are under pretty  good control, some residual rigidity and tremor  2. Dementia in Parkinson's disease -no longer on medications for this--dependent in adls except feeding self and requires set up and cues even for this  3. DM type 2 causing vascular disease -cont levemir with novolog meal coverage, not on ace/arb and no allergy -not on asa or ace or statin--due to her goals of care being palliative at this point--her daughter was considering hospice care at the time when the necrosis of her toe began  4. Atherosclerotic PVD with ulceration -had surgery by Dr. Darrick PennaFields -has never fully healed  5. Gastroesophageal reflux disease, esophagitis presence not specified -no longer on regular medication, has prn maalox/mylanta  6. Glaucoma -cont cosopt drops   Family/ staff Communication: seen with unit supervisor  Goals of care:  DNR, long term care, comfort

## 2014-04-18 ENCOUNTER — Telehealth: Payer: Self-pay | Admitting: *Deleted

## 2014-04-18 NOTE — Telephone Encounter (Signed)
[facility]urse [name]'s right 2nd toe amputation site (done 08-25-2013 by Dr. Darrick PennaFields). She reports that the site is "soft and has a slight odor". The patient is afebrile and reports no pain in the foot. There is no erythema or drainage noted by the nurse. The nurse Vance Gather( Shelley Polite) says that their wound doctor will be seeing the patient later this week but she wanted to make another appt for the patient to see Dr. Darrick PennaFields again. I told the nurse that Dr. Darrick PennaFields would not be in the office this week but I would put her on the schedule for next week. She voiced agreement and I told her to call us back if the patient's condition worsened or she could send her to the ED for evaluation.

## 2014-04-22 LAB — HEMOGLOBIN A1C: Hgb A1c MFr Bld: 7.2 % — AB (ref 4.0–6.0)

## 2014-04-28 ENCOUNTER — Ambulatory Visit: Payer: Medicare Other | Admitting: Vascular Surgery

## 2014-05-05 ENCOUNTER — Ambulatory Visit: Payer: Medicare Other | Admitting: Vascular Surgery

## 2014-05-11 ENCOUNTER — Telehealth: Payer: Self-pay

## 2014-05-11 NOTE — Telephone Encounter (Signed)
Elease Hashimotoatricia was not available, left msg for her to call back. Notified [facility] BoerVicki at [facility] of appt on 05/24/14 @ 12:30pm. dpm

## 2014-05-11 NOTE — Telephone Encounter (Signed)
rec'd call from pt's daughter.  Reported pt. still has not healed right 2nd toe amp site; missed 2 appts. with Dr. Darrick PennaFields recently, due to conflict.  Asking to reschedule.  Call placed to Narda BondsShelly Polite, treatment nurse @ [facility]  Informed that next avail appt. With Dr. Darrick PennaFields is not until 7/21; questioned status of wound right 2nd toe amp. Site.  Reported the Wound MD has been checking her weekly, and debrided the wound x 2.  Stated the wound is > 0.1 cm in depth, and has a yellow slough; denies any odor at this time.  Reported the Wound MD will see pt. again today.  Reported daughter is requesting another appt. W/ Dr. Darrick PennaFields for reevaluation.  Advised nurse from [facility] will schedue pt. appt. 7/21, and to call office if symptoms worsen, prior to 7/21.  Agreed.

## 2014-05-23 ENCOUNTER — Encounter: Payer: Self-pay | Admitting: Vascular Surgery

## 2014-05-24 ENCOUNTER — Encounter: Payer: Self-pay | Admitting: Vascular Surgery

## 2014-05-24 ENCOUNTER — Ambulatory Visit (INDEPENDENT_AMBULATORY_CARE_PROVIDER_SITE_OTHER): Payer: Medicare Other | Admitting: Vascular Surgery

## 2014-05-24 VITALS — BP 102/55 | HR 72 | Temp 96.7°F | Ht 61.0 in | Wt 160.0 lb

## 2014-05-24 DIAGNOSIS — I70209 Unspecified atherosclerosis of native arteries of extremities, unspecified extremity: Secondary | ICD-10-CM

## 2014-05-24 DIAGNOSIS — I739 Peripheral vascular disease, unspecified: Secondary | ICD-10-CM

## 2014-05-24 DIAGNOSIS — L98499 Non-pressure chronic ulcer of skin of other sites with unspecified severity: Secondary | ICD-10-CM | POA: Diagnosis not present

## 2014-05-24 NOTE — Progress Notes (Signed)
Patient is an 80 year old female status post amputation of her right second toe October of 2014. She returns for further followup. She has had no deterioration of the wound but it has not completely healed. There is no obvious drainage. She does not complain of pain. She is demented. Her daughter was present for the office visit today.  Physical exam:  Filed Vitals:   05/24/14 1212  BP: 102/55  Pulse: 72  Temp: 96.7 F (35.9 C)  TempSrc: Oral  Height: 5\' 1"  (1.549 m)  Weight: 160 lb (72.576 kg)  SpO2: 99%     Right foot moist yellow eschar 3 x 2 cm diameter toe amputation site no erythema   Assessment: Slowly worsening breakout amputation site  Plan: The patient and family have been informed that if the wound does not heal she would require an above-knee amputation. The wound has deteriorated some over the last 3 months. It is currently not infected. She does not have pain. I instructed the patient's daughter to continue local wound care. She only needs followup if she wishes to consider a right above-knee amputation. I would only consider this for worsening pain infection as a palliative operation only  Fabienne Brunsharles Naesha Buckalew, MD [facility] Office: [phone] Pager: [phone]

## 2014-06-28 ENCOUNTER — Encounter: Payer: Self-pay | Admitting: Internal Medicine

## 2014-06-28 ENCOUNTER — Non-Acute Institutional Stay (SKILLED_NURSING_FACILITY): Payer: Medicare Other | Admitting: Internal Medicine

## 2014-06-28 DIAGNOSIS — H409 Unspecified glaucoma: Secondary | ICD-10-CM

## 2014-06-28 DIAGNOSIS — I96 Gangrene, not elsewhere classified: Secondary | ICD-10-CM

## 2014-06-28 DIAGNOSIS — F0280 Dementia in other diseases classified elsewhere without behavioral disturbance: Secondary | ICD-10-CM

## 2014-06-28 DIAGNOSIS — G20A1 Parkinson's disease without dyskinesia, without mention of fluctuations: Secondary | ICD-10-CM

## 2014-06-28 DIAGNOSIS — E1159 Type 2 diabetes mellitus with other circulatory complications: Secondary | ICD-10-CM

## 2014-06-28 DIAGNOSIS — G20 Parkinson's disease: Secondary | ICD-10-CM

## 2014-06-28 DIAGNOSIS — L98499 Non-pressure chronic ulcer of skin of other sites with unspecified severity: Secondary | ICD-10-CM

## 2014-06-28 DIAGNOSIS — I70209 Unspecified atherosclerosis of native arteries of extremities, unspecified extremity: Secondary | ICD-10-CM

## 2014-06-28 DIAGNOSIS — I798 Other disorders of arteries, arterioles and capillaries in diseases classified elsewhere: Secondary | ICD-10-CM

## 2014-06-28 DIAGNOSIS — I739 Peripheral vascular disease, unspecified: Secondary | ICD-10-CM

## 2014-06-28 NOTE — Progress Notes (Signed)
Patient ID: [name], female   DOB: 1922/01/15, 80 y.o.   MRN: [id number]  Location:  [facility] Provider:  Gwenith Spitz. Renato Gails, D.O., C.M.D.  Code Status:  DNR  Chief Complaint  Patient presents with  . Medical Management of Chronic Issues    HPI:  80 yo black female long term care resident with PD, dementia, PAD seen for routine visit.  She has been c/o increased pain in her infected toe.    Review of Systems: as per nursing Review of Systems  Constitutional: Negative for fever.  Respiratory: Negative for shortness of breath.   Cardiovascular: Negative for chest pain.  Gastrointestinal: Negative for constipation.  Musculoskeletal:       Pain in toe  Psychiatric/Behavioral: Positive for memory loss.    Medications: Patient's Medications  New Prescriptions   No medications on file  Previous Medications   ACETAMINOPHEN (TYLENOL) 500 MG TABLET    Take 1,000 mg by mouth 2 (two) times daily.   ALUM & MAG HYDROXIDE-SIMETH (MAALOX/MYLANTA) 200-200-20 MG/5ML SUSPENSION    Take 30 mLs by mouth every 6 (six) hours as needed for indigestion or heartburn.   AMLODIPINE (NORVASC) 5 MG TABLET    Take 5 mg by mouth daily.   CARBIDOPA-LEVODOPA (SINEMET IR) 25-100 MG PER TABLET    Take 0.5 tablets by mouth 3 (three) times daily.    CHOLECALCIFEROL 2000 UNITS CAPS    Take 1 capsule by mouth daily.   CLONIDINE (CATAPRES) 0.1 MG TABLET    Take 0.1 mg by mouth daily as needed (sbp >180).   DORZOLAMIDE-TIMOLOL (COSOPT) 22.3-6.8 MG/ML OPHTHALMIC SOLUTION    Place 1 drop into both eyes 2 (two) times daily.   ERYTHROMYCIN OPHTHALMIC OINTMENT    Place 1 application into the left eye 4 (four) times daily. For inflammation   INSULIN ASPART (NOVOLOG) 100 UNIT/ML INJECTION    Inject 5 Units into the skin 3 (three) times daily with meals. For cbg >=150 prior to meals   INSULIN DETEMIR (LEVEMIR) 100 UNIT/ML INJECTION    Inject 12 Units into the skin at bedtime.    MAGNESIUM HYDROXIDE  (MILK OF MAGNESIA) 400 MG/5ML SUSPENSION    Take 30 mLs by mouth daily as needed for mild constipation.   MULTIPLE VITAMIN (MULTIVITAMIN) TABLET    Take 1 tablet by mouth daily.   PROMETHAZINE (PHENERGAN) 25 MG TABLET    Take 25 mg by mouth every 6 (six) hours as needed for nausea or vomiting.   SODIUM HYPOCHLORITE (DAKIN'S FULL STRENGTH) 0.5 % SOLN    Irrigate with 1 application as directed 2 (two) times daily. Apply to right foot.   TRAZODONE (DESYREL) 25 MG TABS TABLET    Take 25 mg by mouth at bedtime.  Modified Medications   No medications on file  Discontinued Medications   CEPHALEXIN (KEFLEX) 500 MG CAPSULE    Take 1 capsule (500 mg total) by mouth 4 (four) times daily.    Physical Exam: Filed Vitals:   06/28/14 1205  BP: 146/62  Pulse: 85  Temp: 97.6 F (36.4 C)  Resp: 16  Height:  (1.626 m)  Weight: 146 lb (66.225 kg)  Physical Exam  Constitutional: She appears well-developed and well-nourished. No distress.  Cardiovascular: Normal rate, regular rhythm, normal heart sounds and intact distal pulses.   Pulmonary/Chest: Effort normal and breath sounds normal. No respiratory distress.  Abdominal: Soft. Bowel sounds are normal.  Musculoskeletal: Normal range of motion.  Neurological: She is alert. She  exhibits abnormal muscle tone.  tremor  Skin:  Redness, warmth, drainage, tenderness from affected toe     Labs reviewed: Basic Metabolic Panel:  Recent Labs  40/98/11 1533 08/28/13 0526 11/05/13 0137  NA 130* 135 139  K 3.8 4.0 3.9  CL 96 100 105  CO2 GLUCOSE 311* 98 183*  BUN CREATININE 0.65 0.55 0.58  CALCIUM 8.5 8.8 8.5    Liver Function Tests: No results found for this basename: AST, ALT, ALKPHOS, BILITOT, PROT, ALBUMIN,  in the last 8760 hours  CBC:  Recent Labs  08/27/13 1533 08/28/13 0526  08/28/13 1955 08/29/13 0555 11/05/13 0137  WBC 10.8* 9.5  --   --  8.8 10.2  NEUTROABS 8.9*  --   --   --   --  8.0*  HGB 8.3*  9.1*  < > 8.7* 8.4* 10.1*  HCT 25.4* 27.6*  < > 26.7* 26.2* 31.7*  MCV 91.4 92.3  --   --  91.6 90.6  PLT 266 279  --   --  273 286  < > = values in this interval not displayed. 04/22/14 hba1c 7.2  Assessment/Plan 1. Gangrene of toe -having increased pain--increase tylenol to max  po tid  2. Atherosclerotic PVD with ulceration -cause of #1, cont to monitor  3. Parkinson's disease -cont sinemet  4. DM type 2 causing vascular disease -CBGs reviewed and are all over the map, but no lows -cont current regimen with levemir and novolog  -controlled considering her age, comorbidities  5. Dementia in Parkinson's disease -off dementia meds due to decline and gangrenous toe -cont comfort care  6. Glaucoma -cont cosopt drops -avoid anticholinergics  Family/ staff Communication: seen with unit supervisor Goals of care: palliative, long term care, dnr  Labs/tests ordered:  None today

## 2014-07-01 ENCOUNTER — Non-Acute Institutional Stay (SKILLED_NURSING_FACILITY): Payer: Medicare Other | Admitting: Internal Medicine

## 2014-07-01 DIAGNOSIS — L97509 Non-pressure chronic ulcer of other part of unspecified foot with unspecified severity: Secondary | ICD-10-CM

## 2014-07-01 DIAGNOSIS — L02619 Cutaneous abscess of unspecified foot: Secondary | ICD-10-CM

## 2014-07-01 DIAGNOSIS — L97519 Non-pressure chronic ulcer of other part of right foot with unspecified severity: Secondary | ICD-10-CM

## 2014-07-01 DIAGNOSIS — L03119 Cellulitis of unspecified part of limb: Secondary | ICD-10-CM

## 2014-07-01 DIAGNOSIS — L03115 Cellulitis of right lower limb: Secondary | ICD-10-CM

## 2014-07-03 NOTE — Progress Notes (Signed)
Patient ID: [name], female   DOB: January 30, 1922, 80 y.o.   MRN: [id number]   Facility: [facility]  Chief Complaint  Patient presents with  . Acute Visit    non healingfoot wound, family visit   Allergies  Allergen Reactions  . Contrast Media [Iodinated Diagnostic Agents] Other (See Comments)    unknown  . Fish Allergy Other (See Comments)    Reaction unspecified on MAR  . Iodine Other (See Comments)    Reaction unspecified on MAR  . Shellfish Allergy Other (See Comments)    Reaction unspecified on MAR   HPI 80 y/o female patient with parkinson's disease and severe PAD is seen today for further worsening of her non healing wound in right foot. She has had right 2nd toe amputation in 10/14 after which she has had a non healing wound in the metatarsal area but now wound nurse has noticed drainage in the area and it is tender to touch on exam. Pt and family does not want surgery.  ROS Unable to obtain from patient. Daughter and grandson present in room.  No fever or chills Pt has been at her baseline Daughter mentions that mother did not want any surgery and she is complying by her wishes  Past Medical History  Diagnosis Date  . Fall   . FTT (failure to thrive) in adult   . Anemia   . Diabetes mellitus without complication   . Anxiety   . Glaucoma   . Acute bronchitis 11/22/2011  . Unspecified vitamin D deficiency 08/30/2011  . Vascular dementia, uncomplicated 02/26/2011  . Unspecified late effects of cerebrovascular disease 11/18/2008  . Reflux esophagitis 11/18/2008  . Ulcer of other part of foot 11/18/2008  . Shortness of breath 11/18/2008  . Paralysis agitans 11/03/2008  . Unspecified essential hypertension 11/03/2008  . Irritable bowel syndrome 11/03/2008  . Ulcer     right second toe   Medication reviewed. See MAR  Physical exam Vss, afberile  gen- elderly female in NAD HEENT- no pallor, no icterus, no LAD, MMM cvs- normal s1, s2,  rrr respi- CTAB Ext- no palpable distal pulses in right leg. Denuded area on right foot of 4x2 cm with visible tendons, seroanginous drainage, tender on palpation, foul odor Neuro- alert, has dementia  Assessment/plan  Right foot ulcer In setting of her PAD. Pt seen by vascular and plan is to have right AKA for non healing wound. Family does not want to proceed with surgery at present. Explained about risk for gangrene, osteomyelitis and risk for sepsis given her age and other medical co-morbidities including DM. Family voices understanding this and would like some time and will let us know their decision. Agree with wound care consult and antibiotic coverage for now. Will provide wound care referral  Cellulitis Has infection of the wound and concerns for possible bone infection as well. No crepitus on exam. Will start her empirically on doxycyline 100 mg bid for 10 days for now with florastor. Get xray of right foot to rule out osteomyelitis. Wound centre referral. Monitor for fever and early signs of systemic infection

## 2014-07-19 ENCOUNTER — Non-Acute Institutional Stay (SKILLED_NURSING_FACILITY): Payer: Medicare Other | Admitting: Internal Medicine

## 2014-07-19 DIAGNOSIS — L259 Unspecified contact dermatitis, unspecified cause: Secondary | ICD-10-CM

## 2014-07-19 NOTE — Progress Notes (Signed)
Patient ID: [name], female   DOB: 1922/04/05, 80 y.o.   MRN: [id number]  Location:  [facility] Provider:  Gwenith Spitz. Renato Gails, D.O., C.M.D.  Code Status:  DNR  Chief Complaint  Patient presents with  . Acute Visit    rash x 2 days    HPI:  On doxycycline for toe infection Has had diffuse pruritic rash that began yesterday On call started on hydrocortisone cream qid  Review of Systems:  Review of Systems  Unable to perform ROS: dementia    Medications: Patient's Medications  New Prescriptions   No medications on file  Previous Medications   ACETAMINOPHEN (TYLENOL) 500 MG TABLET    Take 1,000 mg by mouth 2 (two) times daily.   ALUM & MAG HYDROXIDE-SIMETH (MAALOX/MYLANTA) 200-200-20 MG/5ML SUSPENSION    Take 30 mLs by mouth every 6 (six) hours as needed for indigestion or heartburn.   AMLODIPINE (NORVASC) 5 MG TABLET    Take 5 mg by mouth daily.   CARBIDOPA-LEVODOPA (SINEMET IR) 25-100 MG PER TABLET    Take 0.5 tablets by mouth 3 (three) times daily.    CHOLECALCIFEROL 2000 UNITS CAPS    Take 1 capsule by mouth daily.   CLONIDINE (CATAPRES) 0.1 MG TABLET    Take 0.1 mg by mouth daily as needed (sbp >180).   DORZOLAMIDE-TIMOLOL (COSOPT) 22.3-6.8 MG/ML OPHTHALMIC SOLUTION    Place 1 drop into both eyes 2 (two) times daily.   HYDROCORTISONE CREAM 1 %    Apply 1 application topically 4 (four) times daily. Right shoulder   INSULIN ASPART (NOVOLOG) 100 UNIT/ML INJECTION    Inject 5 Units into the skin 3 (three) times daily with meals. For cbg >=150 prior to meals   INSULIN DETEMIR (LEVEMIR) 100 UNIT/ML INJECTION    Inject 12 Units into the skin at bedtime.    MAGNESIUM HYDROXIDE (MILK OF MAGNESIA) 400 MG/5ML SUSPENSION    Take 30 mLs by mouth daily as needed for mild constipation.   MULTIPLE VITAMIN (MULTIVITAMIN) TABLET    Take 1 tablet by mouth daily.   PROMETHAZINE (PHENERGAN) 25 MG TABLET    Take 25 mg by mouth every 6 (six) hours as needed for nausea or vomiting.     SILVER (AQUACEL AG FOAM EX)    Apply 1 application topically 3 (three) times a week. To right foot   TRAZODONE (DESYREL) 25 MG TABS TABLET    Take 25 mg by mouth at bedtime.  Modified Medications   No medications on file  Discontinued Medications   SODIUM HYPOCHLORITE (DAKIN'S FULL STRENGTH) 0.5 % SOLN    Irrigate with 1 application as directed 2 (two) times daily. Apply to right foot.    Physical Exam: Filed Vitals:   07/19/14 1155  BP: 112/58  Pulse: 87  Temp: 97 F (36.1 C)  Resp: 18  Height:  (1.626 m)  Weight: 145 lb (65.772 kg)   Physical Exam  Skin:  Diffuse papular rash, raised, evidence of excoriation on areas she can reach    Assessment/Plan 1. Contact dermatitis -cont hydrocortisone cream qid until resolution -if it does not improved, d/c doxycycline (for toe infection) and call providers for new orders  Family/ staff Communication: seen with unit supervisor Goals of care: DNR, long term care, comfort measures  Labs/tests ordered:  No new

## 2014-07-21 ENCOUNTER — Encounter (HOSPITAL_BASED_OUTPATIENT_CLINIC_OR_DEPARTMENT_OTHER): Payer: Medicare Other | Attending: Internal Medicine

## 2014-07-21 DIAGNOSIS — S98139A Complete traumatic amputation of one unspecified lesser toe, initial encounter: Secondary | ICD-10-CM | POA: Insufficient documentation

## 2014-07-21 DIAGNOSIS — Y835 Amputation of limb(s) as the cause of abnormal reaction of the patient, or of later complication, without mention of misadventure at the time of the procedure: Secondary | ICD-10-CM | POA: Diagnosis not present

## 2014-07-21 DIAGNOSIS — L97509 Non-pressure chronic ulcer of other part of unspecified foot with unspecified severity: Secondary | ICD-10-CM | POA: Diagnosis not present

## 2014-07-21 DIAGNOSIS — T8189XA Other complications of procedures, not elsewhere classified, initial encounter: Secondary | ICD-10-CM | POA: Diagnosis not present

## 2014-07-21 DIAGNOSIS — E1169 Type 2 diabetes mellitus with other specified complication: Secondary | ICD-10-CM | POA: Insufficient documentation

## 2014-07-22 NOTE — Progress Notes (Signed)
[facility]  NAME:  [name], [name] NO.:  [id number]  MEDICAL RECORD NO.:  [id number]      DATE OF BIRTH:  11-11-1921  PHYSICIAN:  Ardath Sax, M.D.           VISIT DATE:                                  OFFICE VISIT   [name] is a 80 year old lady who comes to [place] with a diabetic ulcer on the dorsal aspect of her right foot.  I would classify it as a Wagner 3.  This has been present for about a year since she had an amputation of her second toe.  Since that time, this wound just has not healed and today required debriding of necrotic material at the base of the wound.  The wound is about 2.5 to 3 cm long and 8 mm wide.  It is undoubtedly classified as a nonhealing surgical wound complicated by diabetes.  So, I would classify it as a Wagner 3 diabetic ulcer, dorsal aspect of right foot.  This lady has dementia.  She is on several medicines for hypertension including Norvasc and Catapres.  She is also on NovoLog insulin, vitamins and trazodone.  She will be treated with silver alginate and perhaps, we will be able to apply soon for a Dermagraft to help heal this diabetic ulcer.     Ardath Sax, M.D.     PP/MEDQ  D:  07/21/2014  T:  07/21/2014  Job:  [id number]

## 2014-07-28 DIAGNOSIS — E1169 Type 2 diabetes mellitus with other specified complication: Secondary | ICD-10-CM | POA: Diagnosis not present

## 2014-07-28 DIAGNOSIS — T8189XA Other complications of procedures, not elsewhere classified, initial encounter: Secondary | ICD-10-CM | POA: Diagnosis not present

## 2014-07-28 DIAGNOSIS — L97509 Non-pressure chronic ulcer of other part of unspecified foot with unspecified severity: Secondary | ICD-10-CM | POA: Diagnosis not present

## 2014-07-28 DIAGNOSIS — S98139A Complete traumatic amputation of one unspecified lesser toe, initial encounter: Secondary | ICD-10-CM | POA: Diagnosis not present

## 2014-08-04 ENCOUNTER — Encounter (HOSPITAL_BASED_OUTPATIENT_CLINIC_OR_DEPARTMENT_OTHER): Payer: Medicare Other | Attending: Internal Medicine

## 2014-08-04 DIAGNOSIS — E11621 Type 2 diabetes mellitus with foot ulcer: Secondary | ICD-10-CM | POA: Diagnosis present

## 2014-08-04 DIAGNOSIS — L97412 Non-pressure chronic ulcer of right heel and midfoot with fat layer exposed: Secondary | ICD-10-CM | POA: Diagnosis not present

## 2014-08-04 DIAGNOSIS — Y839 Surgical procedure, unspecified as the cause of abnormal reaction of the patient, or of later complication, without mention of misadventure at the time of the procedure: Secondary | ICD-10-CM | POA: Diagnosis not present

## 2014-08-04 DIAGNOSIS — E1151 Type 2 diabetes mellitus with diabetic peripheral angiopathy without gangrene: Secondary | ICD-10-CM | POA: Diagnosis not present

## 2014-08-04 DIAGNOSIS — Z89421 Acquired absence of other right toe(s): Secondary | ICD-10-CM | POA: Diagnosis not present

## 2014-08-04 DIAGNOSIS — T8189XD Other complications of procedures, not elsewhere classified, subsequent encounter: Secondary | ICD-10-CM | POA: Diagnosis not present

## 2014-08-16 ENCOUNTER — Non-Acute Institutional Stay (SKILLED_NURSING_FACILITY): Payer: Medicare Other | Admitting: Internal Medicine

## 2014-08-16 ENCOUNTER — Encounter: Payer: Self-pay | Admitting: Internal Medicine

## 2014-08-16 DIAGNOSIS — G20 Parkinson's disease: Secondary | ICD-10-CM | POA: Diagnosis not present

## 2014-08-16 DIAGNOSIS — L98499 Non-pressure chronic ulcer of skin of other sites with unspecified severity: Secondary | ICD-10-CM

## 2014-08-16 DIAGNOSIS — E1159 Type 2 diabetes mellitus with other circulatory complications: Secondary | ICD-10-CM

## 2014-08-16 DIAGNOSIS — F0280 Dementia in other diseases classified elsewhere without behavioral disturbance: Secondary | ICD-10-CM

## 2014-08-16 DIAGNOSIS — I10 Essential (primary) hypertension: Secondary | ICD-10-CM

## 2014-08-16 DIAGNOSIS — K5900 Constipation, unspecified: Secondary | ICD-10-CM

## 2014-08-16 DIAGNOSIS — E1151 Type 2 diabetes mellitus with diabetic peripheral angiopathy without gangrene: Secondary | ICD-10-CM

## 2014-08-16 DIAGNOSIS — L97519 Non-pressure chronic ulcer of other part of right foot with unspecified severity: Secondary | ICD-10-CM

## 2014-08-16 DIAGNOSIS — K5909 Other constipation: Secondary | ICD-10-CM

## 2014-08-16 DIAGNOSIS — I739 Peripheral vascular disease, unspecified: Secondary | ICD-10-CM | POA: Diagnosis not present

## 2014-08-16 DIAGNOSIS — H409 Unspecified glaucoma: Secondary | ICD-10-CM

## 2014-08-16 DIAGNOSIS — G20A1 Parkinson's disease without dyskinesia, without mention of fluctuations: Secondary | ICD-10-CM

## 2014-08-16 DIAGNOSIS — I70209 Unspecified atherosclerosis of native arteries of extremities, unspecified extremity: Secondary | ICD-10-CM

## 2014-08-16 NOTE — Progress Notes (Signed)
Patient ID: [name], female   DOB: [birth year], 80 y.o.   MRN: [id number]  Location:  [facility] Provider:  Rexene Edison. Mariea Clonts, D.O., C.M.D.  Code Status: DNR  Chief Complaint  Patient presents with  . Acute Visit    hypoglycemia, decreased po intake  . Medical Management of Chronic Issues    HPI:  80 yo long term care resident with Parkinsons, PAD, diabetes, depression, htn, vitamin D deficiency, glaucoma, chronic constipation seen for med mgt of chronic diseases and acutely due to hypoglycemia with decreased po intake.  She is down 3 lbs in 2 mos.  Her goals are palliative as discussed with her daughter.  She is receiving magic cup bid and propass bid in house pudding.  She is on a concho nas creamy pureed diet with honey thickened liquids.    Her dementia from her PD is progressing--she is less and less verbal.    Review of Systems:  Review of Systems  Unable to perform ROS: dementia    Medications: Patient's Medications  New Prescriptions   No medications on file  Previous Medications   ACETAMINOPHEN (TYLENOL) 500 MG TABLET    Take 1,000 mg by mouth 2 (two) times daily.   ALUM & MAG HYDROXIDE-SIMETH (MAALOX/MYLANTA) 200-200-20 MG/5ML SUSPENSION    Take 30 mLs by mouth every 6 (six) hours as needed for indigestion or heartburn.   AMLODIPINE (NORVASC) 5 MG TABLET    Take 5 mg by mouth daily.   CARBIDOPA-LEVODOPA (SINEMET IR) 25-100 MG PER TABLET    Take 0.5 tablets by mouth 3 (three) times daily.    CHOLECALCIFEROL 2000 UNITS CAPS    Take 1 capsule by mouth daily.   CLONIDINE (CATAPRES) 0.1 MG TABLET    Take 0.1 mg by mouth daily as needed (sbp >180).   DORZOLAMIDE-TIMOLOL (COSOPT) 22.3-6.8 MG/ML OPHTHALMIC SOLUTION    Place 1 drop into both eyes 2 (two) times daily.   HYDROCORTISONE CREAM 1 %    Apply 1 application topically 4 (four) times daily. Right shoulder   INSULIN ASPART (NOVOLOG) 100 UNIT/ML INJECTION    Inject 5 Units into the skin 3 (three) times  daily with meals. For cbg >=150 prior to meals   INSULIN DETEMIR (LEVEMIR) 100 UNIT/ML INJECTION    Inject 12 Units into the skin at bedtime.    MAGNESIUM HYDROXIDE (MILK OF MAGNESIA) 400 MG/5ML SUSPENSION    Take 30 mLs by mouth daily as needed for mild constipation.   MICONAZOLE (MICOTIN) 2 % CREAM    Apply 1 application topically 3 (three) times daily.   MULTIPLE VITAMIN (MULTIVITAMIN) TABLET    Take 1 tablet by mouth daily.   PROMETHAZINE (PHENERGAN) 25 MG TABLET    Take 25 mg by mouth every 6 (six) hours as needed for nausea or vomiting.   SILVER (AQUACEL AG FOAM EX)    Apply 1 application topically 3 (three) times a week. To right foot   TRAZODONE (DESYREL) 25 MG TABS TABLET    Take 25 mg by mouth at bedtime.  Modified Medications   No medications on file  Discontinued Medications   SODIUM HYPOCHLORITE (DAKIN'S FULL STRENGTH) 0.5 % SOLN    Irrigate with 1 application as directed 2 (two) times daily. Apply to right foot.    Physical Exam: Filed Vitals:   08/16/14 1202  BP: 134/53  Pulse: 77  Temp: 98.1 F (36.7 C)  Resp: 16  Height: 5' 4"  (1.626 m)  Weight: 143 lb (64.864  kg)  Physical Exam  Constitutional: She appears well-developed and well-nourished. No distress.  Cardiovascular: Normal rate, regular rhythm and normal heart sounds.   Pulmonary/Chest: Effort normal and breath sounds normal.  Abdominal: Soft. Bowel sounds are normal. She exhibits no distension and no mass. There is no tenderness.  Musculoskeletal:  cogwheeling of arms  Neurological: She is alert.  Speaks minimally, hypophonic, also some aphasia  Skin: Skin is warm.  Right middle toe draining   Psychiatric: She has a normal mood and affect.    Labs reviewed: Basic Metabolic Panel:  Recent Labs  08/27/13 1533 08/28/13 0526 11/05/13 0137  NA 130* 135 139  K 3.8 4.0 3.9  CL 96 100 105  CO2 26 26 23   GLUCOSE 311* 98 183*  BUN 13 10 14   CREATININE 0.65 0.55 0.58  CALCIUM 8.5 8.8 8.5    Liver  Function Tests: No results found for this basename: AST, ALT, ALKPHOS, BILITOT, PROT, ALBUMIN,  in the last 8760 hours  CBC:  Recent Labs  08/27/13 1533 08/28/13 0526  08/28/13 1955 08/29/13 0555 11/05/13 0137  WBC 10.8* 9.5  --   --  8.8 10.2  NEUTROABS 8.9*  --   --   --   --  8.0*  HGB 8.3* 9.1*  < > 8.7* 8.4* 10.1*  HCT 25.4* 27.6*  < > 26.7* 26.2* 31.7*  MCV 91.4 92.3  --   --  91.6 90.6  PLT 266 279  --   --  273 286  < > = values in this interval not displayed.   Assessment/Plan 1. DM type 2 causing vascular disease -will decrease her levemir to 8 units daily from 12 units daily due to hypoglycemia -may also consider liberalizing her diet in terms of the nas and concho part if intake remains poor -cont magic cup and pro pass as long as it's not preventing her from eating real food  2. Parkinson's disease -cont sinemet  3. Dementia in Parkinson's disease -off meds for dementia due to late stages and lack of remaining benefit -goals are palliative  4. Glaucoma -cont cosopt drops  5. Right foot ulcer -cont aquacel 3x weekly  6. Atherosclerotic PVD with ulceration -cause of #5  7. Chronic constipation -due to PD, cont maalox prn, mom, prn  8. Essential hypertension, benign -bp at goal for her age with norvasc 64m and prn clonidine (will need to check if this is being used and d/c if not)  Family/ staff Communication: seen with unit supervisor  Goals of care: palliative; DNR code status, try to manage here at snf unless comfort goals cannot be met

## 2014-08-18 DIAGNOSIS — E1151 Type 2 diabetes mellitus with diabetic peripheral angiopathy without gangrene: Secondary | ICD-10-CM | POA: Diagnosis not present

## 2014-08-18 DIAGNOSIS — L97412 Non-pressure chronic ulcer of right heel and midfoot with fat layer exposed: Secondary | ICD-10-CM | POA: Diagnosis not present

## 2014-08-18 DIAGNOSIS — Z89421 Acquired absence of other right toe(s): Secondary | ICD-10-CM | POA: Diagnosis not present

## 2014-08-18 DIAGNOSIS — E11621 Type 2 diabetes mellitus with foot ulcer: Secondary | ICD-10-CM | POA: Diagnosis not present

## 2014-08-25 ENCOUNTER — Non-Acute Institutional Stay (SKILLED_NURSING_FACILITY): Payer: Medicare Other | Admitting: Internal Medicine

## 2014-08-25 ENCOUNTER — Encounter: Payer: Self-pay | Admitting: Internal Medicine

## 2014-08-25 DIAGNOSIS — E559 Vitamin D deficiency, unspecified: Secondary | ICD-10-CM

## 2014-08-25 DIAGNOSIS — L97412 Non-pressure chronic ulcer of right heel and midfoot with fat layer exposed: Secondary | ICD-10-CM | POA: Diagnosis not present

## 2014-08-25 DIAGNOSIS — I70209 Unspecified atherosclerosis of native arteries of extremities, unspecified extremity: Secondary | ICD-10-CM

## 2014-08-25 DIAGNOSIS — E1151 Type 2 diabetes mellitus with diabetic peripheral angiopathy without gangrene: Secondary | ICD-10-CM

## 2014-08-25 DIAGNOSIS — I10 Essential (primary) hypertension: Secondary | ICD-10-CM

## 2014-08-25 DIAGNOSIS — E11621 Type 2 diabetes mellitus with foot ulcer: Secondary | ICD-10-CM | POA: Diagnosis not present

## 2014-08-25 DIAGNOSIS — E1159 Type 2 diabetes mellitus with other circulatory complications: Secondary | ICD-10-CM

## 2014-08-25 DIAGNOSIS — G20 Parkinson's disease: Secondary | ICD-10-CM

## 2014-08-25 DIAGNOSIS — F0280 Dementia in other diseases classified elsewhere without behavioral disturbance: Secondary | ICD-10-CM

## 2014-08-25 DIAGNOSIS — K5900 Constipation, unspecified: Secondary | ICD-10-CM

## 2014-08-25 DIAGNOSIS — I739 Peripheral vascular disease, unspecified: Secondary | ICD-10-CM

## 2014-08-25 DIAGNOSIS — Z89421 Acquired absence of other right toe(s): Secondary | ICD-10-CM | POA: Diagnosis not present

## 2014-08-25 DIAGNOSIS — L98499 Non-pressure chronic ulcer of skin of other sites with unspecified severity: Secondary | ICD-10-CM

## 2014-09-01 DIAGNOSIS — E11621 Type 2 diabetes mellitus with foot ulcer: Secondary | ICD-10-CM | POA: Diagnosis not present

## 2014-09-01 DIAGNOSIS — L97412 Non-pressure chronic ulcer of right heel and midfoot with fat layer exposed: Secondary | ICD-10-CM | POA: Diagnosis not present

## 2014-09-01 DIAGNOSIS — Z89421 Acquired absence of other right toe(s): Secondary | ICD-10-CM | POA: Diagnosis not present

## 2014-09-01 DIAGNOSIS — E1151 Type 2 diabetes mellitus with diabetic peripheral angiopathy without gangrene: Secondary | ICD-10-CM | POA: Diagnosis not present

## 2014-09-04 NOTE — Progress Notes (Signed)
This encounter was created in error - please disregard.

## 2014-09-04 NOTE — Progress Notes (Signed)
Patient ID: [name], female   DOB: 1922-05-27, 80 y.o.   MRN: [id number]    Facility[facility] [place]  Chief Complaint  Patient presents with  . Medical Management of Chronic Issues   Allergies  Allergen Reactions  . Contrast Media [Iodinated Diagnostic Agents] Other (See Comments)    unknown  . Fish Allergy Other (See Comments)    Reaction unspecified on MAR  . Iodine Other (See Comments)    Reaction unspecified on MAR  . Shellfish Allergy Other (See Comments)    Reaction unspecified on MAR   HPI 80 yo black female with h/o Parkinson's with dementia, DMII, PVD with ulceration is seen for medical management of chronic diseases. she has been at her baseline as per staff. She is minimally verbal and can tell some of her needs. No falls reported. She has non healing ulcer in her right foot.   Review of Systems  Constitutional: Negative for fever.  Respiratory: Negative for shortness of breath.   Cardiovascular: Negative for chest pain.  Gastrointestinal: Negative for constipation.  Genitourinary: Negative for dysuria.  Musculoskeletal: Negative for falls.  Neurological: Negative for loss of consciousness.  Endo/Heme/Allergies: Does not bruise/bleed easily.  Psychiatric/Behavioral: Positive for memory loss  Past Medical History  Diagnosis Date  . Fall   . FTT (failure to thrive) in adult   . Anemia   . Diabetes mellitus without complication   . Anxiety   . Glaucoma   . Acute bronchitis 11/22/2011  . Unspecified vitamin D deficiency 08/30/2011  . Vascular dementia, uncomplicated 02/26/2011  . Unspecified late effects of cerebrovascular disease 11/18/2008  . Reflux esophagitis 11/18/2008  . Ulcer of other part of foot 11/18/2008  . Shortness of breath 11/18/2008  . Paralysis agitans 11/03/2008  . Unspecified essential hypertension 11/03/2008  . Irritable bowel syndrome 11/03/2008  . Ulcer     right second toe   Current Outpatient Prescriptions  on File Prior to Visit  Medication Sig Dispense Refill  . acetaminophen (TYLENOL) 500 MG tablet Take 1,000 mg by mouth 2 (two) times daily.    Marland Kitchen. alum & mag hydroxide-simeth (MAALOX/MYLANTA) 200-200-20 MG/5ML suspension Take 30 mLs by mouth every 6 (six) hours as needed for indigestion or heartburn.    Marland Kitchen. amLODipine (NORVASC) 5 MG tablet Take 5 mg by mouth daily.    . carbidopa-levodopa (SINEMET IR) 25-100 MG per tablet Take 0.5 tablets by mouth 3 (three) times daily.     . Cholecalciferol 2000 UNITS CAPS Take 1 capsule by mouth daily.    . cloNIDine (CATAPRES) 0.1 MG tablet Take 0.1 mg by mouth daily as needed (sbp >180).    . dorzolamide-timolol (COSOPT) 22.3-6.8 MG/ML ophthalmic solution Place 1 drop into both eyes 2 (two) times daily.    . hydrocortisone cream 1 % Apply 1 application topically 4 (four) times daily. Right shoulder    . insulin aspart (NOVOLOG) 100 UNIT/ML injection Inject 5 Units into the skin 3 (three) times daily with meals. For cbg >=150 prior to meals    . insulin detemir (LEVEMIR) 100 UNIT/ML injection Inject 12 Units into the skin at bedtime.     . magnesium hydroxide (MILK OF MAGNESIA) 400 MG/5ML suspension Take 30 mLs by mouth daily as needed for mild constipation.    . Multiple Vitamin (MULTIVITAMIN) tablet Take 1 tablet by mouth daily.    . promethazine (PHENERGAN) 25 MG tablet Take 25 mg by mouth every 6 (six) hours as needed for nausea or vomiting.    .Marland Kitchen  Silver (AQUACEL AG FOAM EX) Apply 1 application topically 3 (three) times a week. To right foot    . traZODone (DESYREL) 25 mg TABS tablet Take 25 mg by mouth at bedtime.     No current facility-administered medications on file prior to visit.    Physical exam BP 128/80 mmHg  Pulse 66  Temp(Src) 97.3 F (36.3 C)  Resp 14  Ht 5\' 4"  (1.626 m)  Wt 143 lb (64.864 kg)  BMI 24.53 kg/m2  Constitutional: elderly female in no distress. Cardiovascular: Normal rate, regular rhythm and normal heart sounds.     Pulmonary/Chest: Effort normal and breath sounds normal. No respiratory distress.  Abdominal: Soft. Bowel sounds are normal.  Musculoskeletal: Normal range of motion.  Neurological: She is alert but has baseline confusion Skin: Skin is warm and dry. Amputated toe, 4 x1 cm ulcer with seroanginous drainage in right foot, dressing in place Psychiatric: She has a normal mood and affect.   Wt Readings from Last 3 Encounters:  08/25/14 143 lb (64.864 kg)  08/25/14 143 lb (64.864 kg)  08/16/14 143 lb (64.864 kg)   Assessment/Plan  PVD  With ulceration in right foot. Continue wound care. Patient has been seen in wound clinic. Continue decubi-vite  HTN bp controlled. Continue amlodipine 5 mg daily and prn clonidine  Vit d def Continue vit d supplement  DM type 2 Continue levemir with SSI novolog, monitor cbg, check a1c and lipid panel  Constipation Continue her MOM prn  Dementia in Parkinson's disease Decline anticipated. Weight is stable. Continue skin care, monitor po intake, fall precautions and continue assistance with her ADLs. Continue sinemet and trazodone   Family/ staff Communication: seen with unit supervisor  Goals of care: palliative care

## 2014-09-15 ENCOUNTER — Encounter (HOSPITAL_BASED_OUTPATIENT_CLINIC_OR_DEPARTMENT_OTHER): Payer: Medicare Other | Attending: Internal Medicine

## 2014-09-15 DIAGNOSIS — Y835 Amputation of limb(s) as the cause of abnormal reaction of the patient, or of later complication, without mention of misadventure at the time of the procedure: Secondary | ICD-10-CM | POA: Insufficient documentation

## 2014-09-15 DIAGNOSIS — Z89421 Acquired absence of other right toe(s): Secondary | ICD-10-CM | POA: Diagnosis not present

## 2014-09-15 DIAGNOSIS — T8189XD Other complications of procedures, not elsewhere classified, subsequent encounter: Secondary | ICD-10-CM | POA: Diagnosis not present

## 2014-09-15 DIAGNOSIS — S98131D Complete traumatic amputation of one right lesser toe, subsequent encounter: Secondary | ICD-10-CM | POA: Insufficient documentation

## 2014-09-30 IMAGING — CT CT CERVICAL SPINE W/O CM
4 of 5 series · 16 of 33 positions shown, 18 images · non-contrast
Comparison: Prior head CT 01/17/2011

CLINICAL DATA: Fall from wheelchair onto [facility] floor

EXAM:
CT HEAD WITHOUT CONTRAST
CT CERVICAL SPINE WITHOUT CONTRAST
TECHNIQUE: Multidetector CT imaging of the head and cervical spine was
performed following the standard protocol without intravenous
contrast. Multiplanar CT image reconstructions of the cervical spine
were also generated.

[Series 5: c_spine 2.0 i40s 3 · axial · 0.29mm/px · z∈[+227,+335]mm · 4 of 92 slices shown, 5 images]
[im 19/92  soft-tissue]
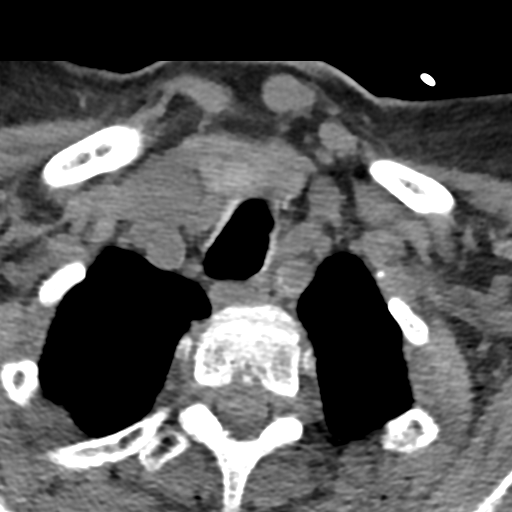
[im 19/92  bone]
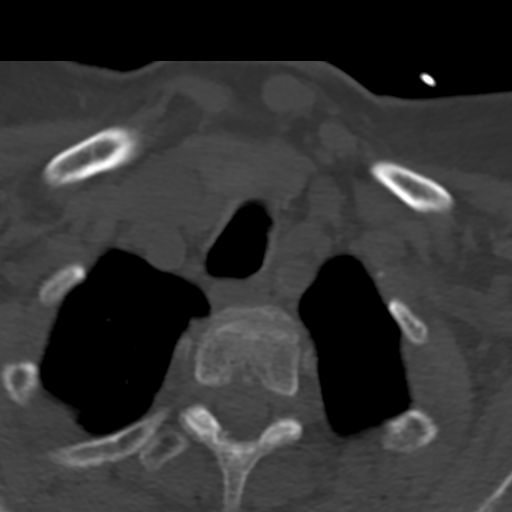
[im 37/92  bone]
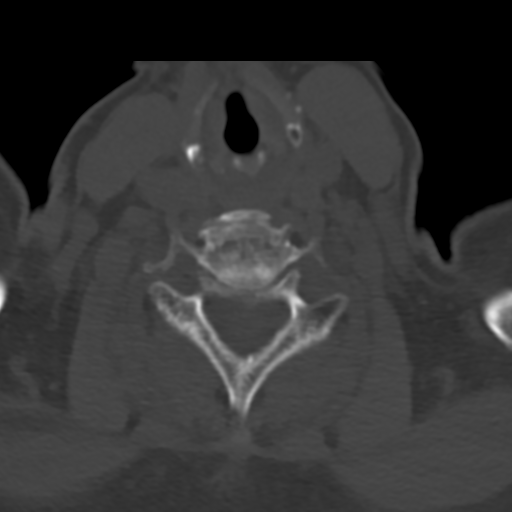
[im 55/92  bone]
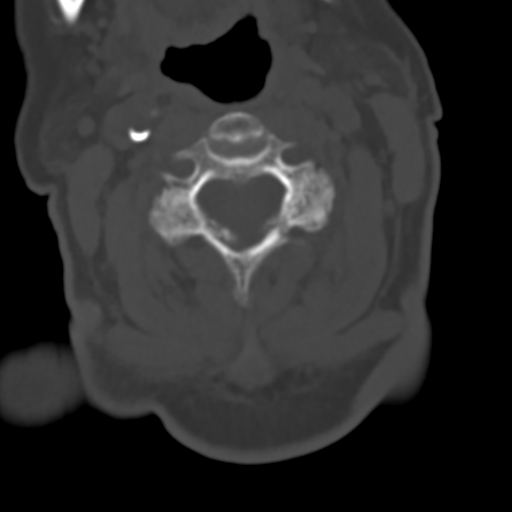
[im 73/92  bone]
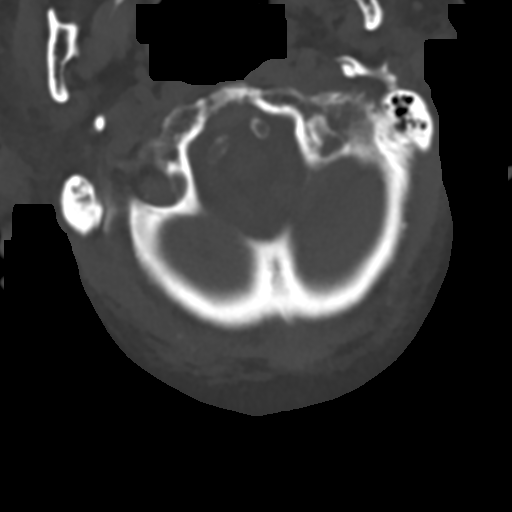

[Series 7: coronals · coronal · 0.39mm/px · 3 of 55 slices shown]
[im 11/55  bone]
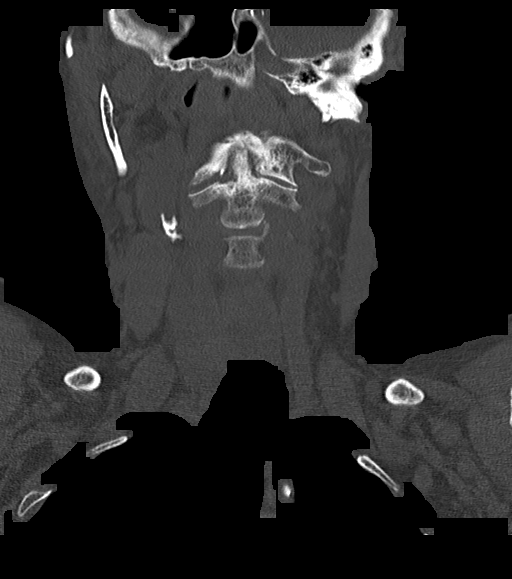
[im 22/55  bone]
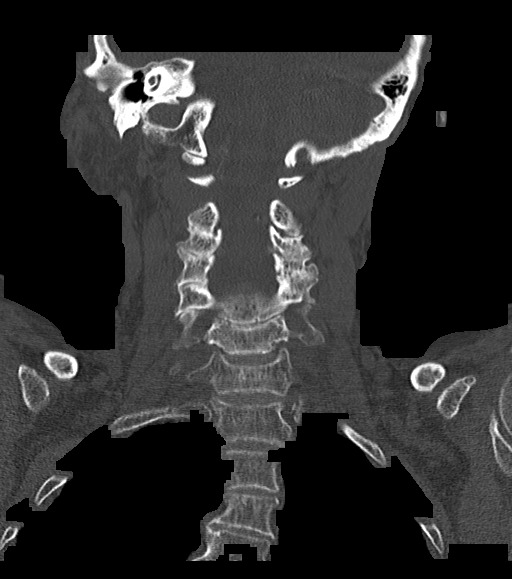
[im 33/55  bone]
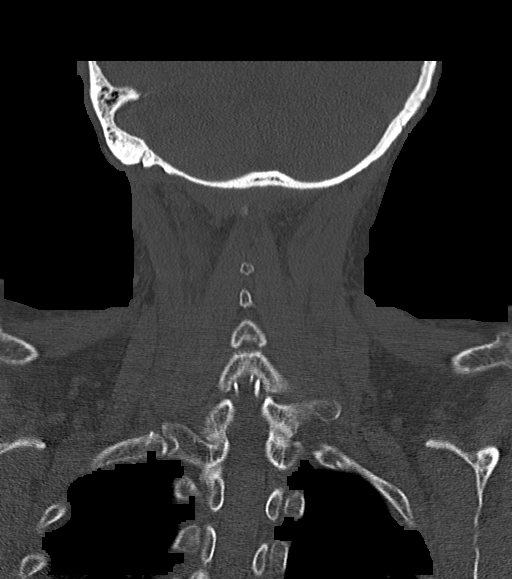

[Series 8: sagittals · sagittal · 0.36mm/px · 5 of 48 slices shown, 6 images]
[im 16/48  bone]
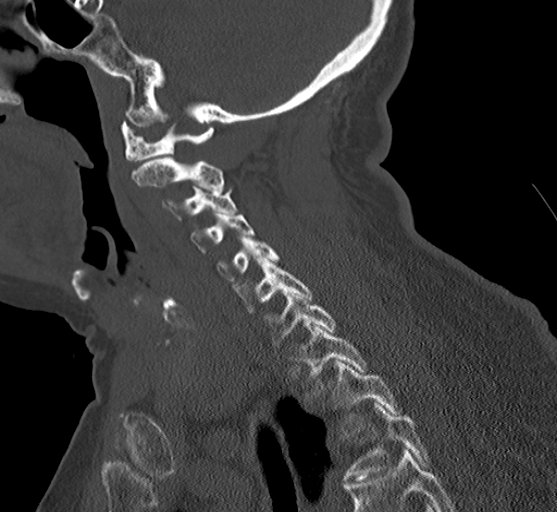
[im 20/48  bone]
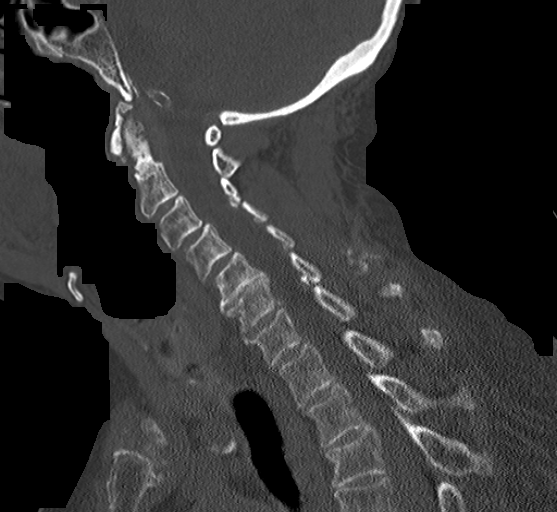
[im 24/48  soft-tissue]
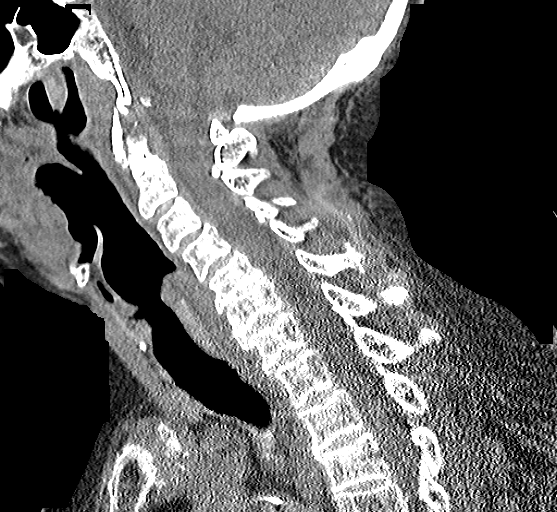
[im 24/48  bone]
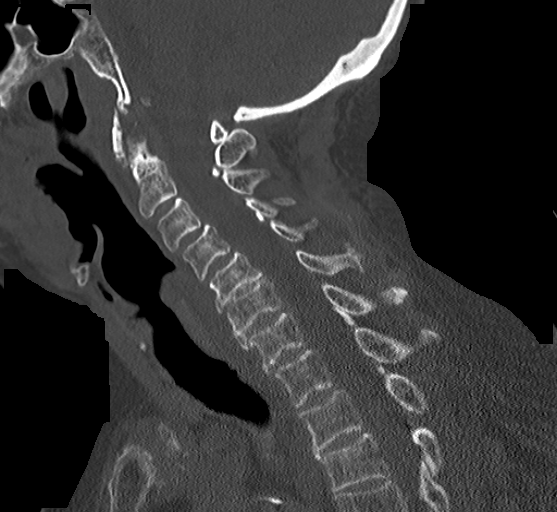
[im 28/48  bone]
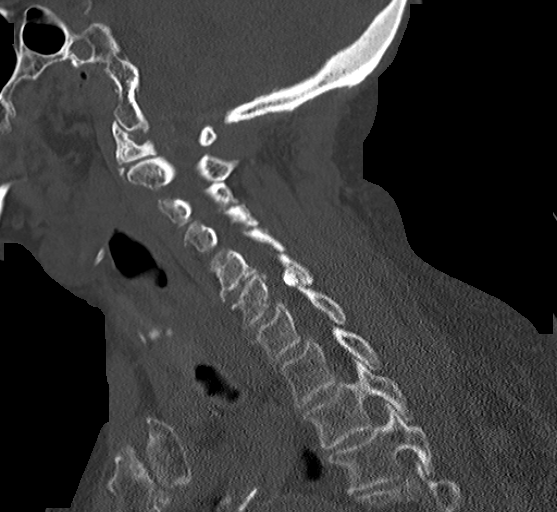
[im 32/48  bone]
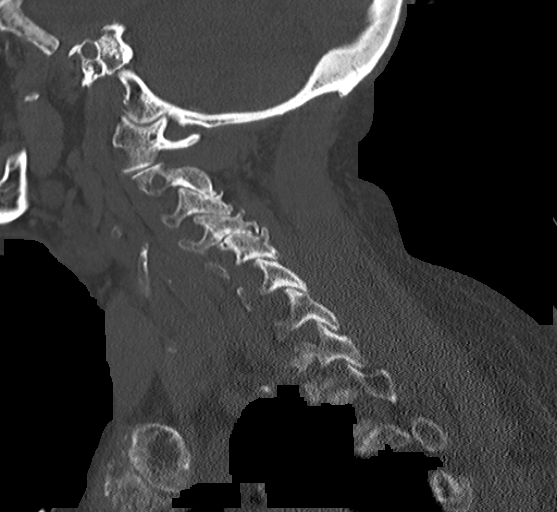

[Series 9: orthogonals · axial · 0.32mm/px · z∈[+189,+294]mm · 4 of 100 slices shown]
[im 20/100  bone]
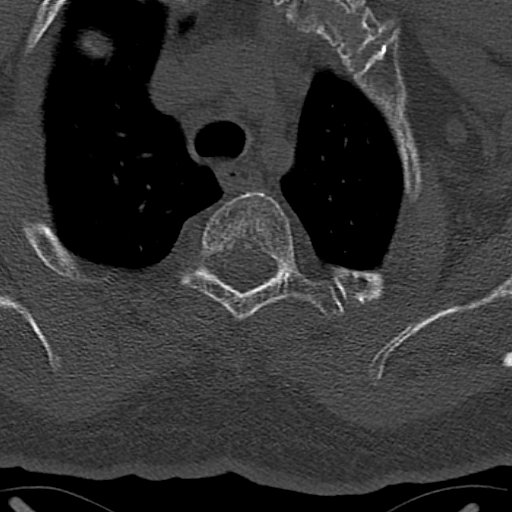
[im 40/100  bone]
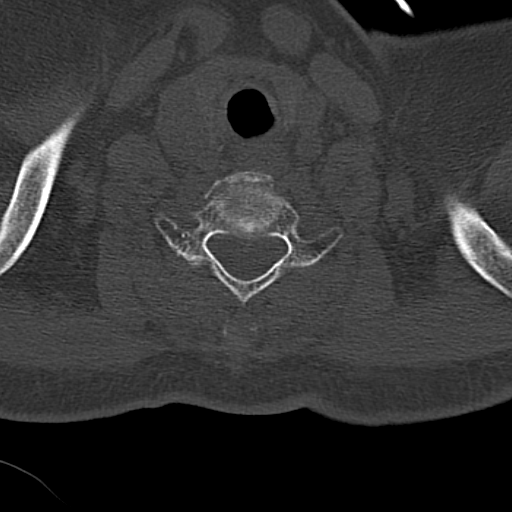
[im 60/100  bone]
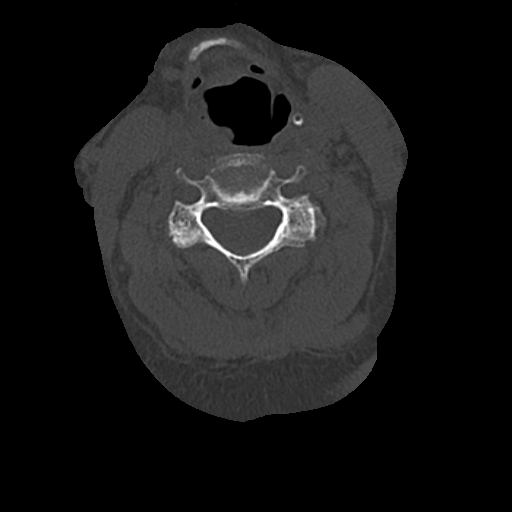
[im 80/100  bone]
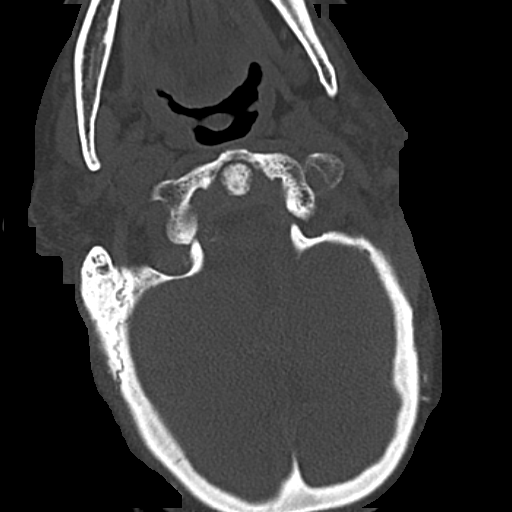

[16 of 33 positions shown; findings below may reference images not displayed]

FINDINGS: CT HEAD FINDINGS

Negative for acute intracranial hemorrhage, acute infarction, mass,
mass effect, hydrocephalus or midline shift. Gray-white
differentiation is preserved throughout. Stable global cerebral loss
consistent with atrophy. Periventricular white matter
hypoattenuation remains consistent with the sequelae of longstanding
microvascular ischemic white matter disease. No significant interval
progression. Bilateral basal ganglia calcifications noted
incidentally. Calcifications of the bilateral cavernous carotid
arteries and intracranial vertebral arteries. Globes and orbits are
intact and symmetric bilaterally. Subcutaneous emphysema of the soft
tissues overlying the frontal bone consistent with the clinical
history of laceration. No large scalp hematoma. No underlying
calvarial fracture. Normal aeration of the mastoid air cells and
visualized paranasal sinuses.

CT CERVICAL SPINE FINDINGS

No acute fracture, malalignment or prevertebral soft tissue
swelling. Multilevel cervical spondylosis. Spondylitic changes are
most significant at C5-C6. No significant listhesis. Mild multilevel
facet arthropathy. No lytic or blastic osseous lesion. Partial
calcification of the ligamentum flavum. Unremarkable CT appearance
of the thyroid gland. No acute soft tissue abnormality. The lung
apices are unremarkable.
IMPRESSION: CT HEAD

1. No acute intracranial abnormality.
2. Stable atrophy and chronic microvascular ischemic white matter
changes without significant interval progression.
3. Forehead laceration without significant associated scalp hematoma
or underlying calvarial fracture.
4. Intracranial atherosclerosis.
CT CSPINE

1. No acute fracture or malalignment.
2. Multilevel degenerative spondylosis and facet arthropathy.

## 2014-10-01 ENCOUNTER — Encounter: Payer: Self-pay | Admitting: Internal Medicine

## 2014-10-06 ENCOUNTER — Encounter (HOSPITAL_BASED_OUTPATIENT_CLINIC_OR_DEPARTMENT_OTHER): Payer: Medicare Other | Attending: Internal Medicine

## 2014-10-06 DIAGNOSIS — T8189XA Other complications of procedures, not elsewhere classified, initial encounter: Secondary | ICD-10-CM | POA: Diagnosis not present

## 2014-10-06 DIAGNOSIS — Y839 Surgical procedure, unspecified as the cause of abnormal reaction of the patient, or of later complication, without mention of misadventure at the time of the procedure: Secondary | ICD-10-CM | POA: Insufficient documentation

## 2014-10-06 DIAGNOSIS — S91301A Unspecified open wound, right foot, initial encounter: Secondary | ICD-10-CM | POA: Diagnosis not present

## 2014-10-14 DIAGNOSIS — T8189XA Other complications of procedures, not elsewhere classified, initial encounter: Secondary | ICD-10-CM | POA: Diagnosis not present

## 2014-10-20 DIAGNOSIS — S91301A Unspecified open wound, right foot, initial encounter: Secondary | ICD-10-CM | POA: Diagnosis not present

## 2014-10-20 DIAGNOSIS — T8189XA Other complications of procedures, not elsewhere classified, initial encounter: Secondary | ICD-10-CM | POA: Diagnosis not present

## 2014-11-03 DIAGNOSIS — T8189XA Other complications of procedures, not elsewhere classified, initial encounter: Secondary | ICD-10-CM | POA: Diagnosis not present

## 2014-11-03 DIAGNOSIS — S91301A Unspecified open wound, right foot, initial encounter: Secondary | ICD-10-CM | POA: Diagnosis not present

## 2014-11-14 ENCOUNTER — Non-Acute Institutional Stay (SKILLED_NURSING_FACILITY): Payer: Medicare Other | Admitting: Adult Health

## 2014-11-14 DIAGNOSIS — I10 Essential (primary) hypertension: Secondary | ICD-10-CM

## 2014-11-14 DIAGNOSIS — M150 Primary generalized (osteo)arthritis: Secondary | ICD-10-CM

## 2014-11-14 DIAGNOSIS — M159 Polyosteoarthritis, unspecified: Secondary | ICD-10-CM

## 2014-11-14 DIAGNOSIS — R1310 Dysphagia, unspecified: Secondary | ICD-10-CM

## 2014-11-14 DIAGNOSIS — E1159 Type 2 diabetes mellitus with other circulatory complications: Secondary | ICD-10-CM

## 2014-11-14 DIAGNOSIS — G20 Parkinson's disease: Secondary | ICD-10-CM

## 2014-11-14 DIAGNOSIS — E1151 Type 2 diabetes mellitus with diabetic peripheral angiopathy without gangrene: Secondary | ICD-10-CM

## 2014-11-14 DIAGNOSIS — F0150 Vascular dementia without behavioral disturbance: Secondary | ICD-10-CM

## 2014-11-17 ENCOUNTER — Encounter (HOSPITAL_BASED_OUTPATIENT_CLINIC_OR_DEPARTMENT_OTHER): Payer: Medicare Other | Attending: Internal Medicine

## 2014-11-17 DIAGNOSIS — L97522 Non-pressure chronic ulcer of other part of left foot with fat layer exposed: Secondary | ICD-10-CM | POA: Insufficient documentation

## 2014-11-17 DIAGNOSIS — E11621 Type 2 diabetes mellitus with foot ulcer: Secondary | ICD-10-CM | POA: Insufficient documentation

## 2014-11-24 DIAGNOSIS — L97522 Non-pressure chronic ulcer of other part of left foot with fat layer exposed: Secondary | ICD-10-CM | POA: Diagnosis not present

## 2014-11-24 DIAGNOSIS — E11621 Type 2 diabetes mellitus with foot ulcer: Secondary | ICD-10-CM | POA: Diagnosis not present

## 2014-11-26 ENCOUNTER — Encounter: Payer: Self-pay | Admitting: Internal Medicine

## 2014-12-08 ENCOUNTER — Encounter (HOSPITAL_BASED_OUTPATIENT_CLINIC_OR_DEPARTMENT_OTHER): Payer: Medicare Other | Attending: Internal Medicine

## 2014-12-08 DIAGNOSIS — E11621 Type 2 diabetes mellitus with foot ulcer: Secondary | ICD-10-CM | POA: Diagnosis not present

## 2014-12-08 DIAGNOSIS — S91301D Unspecified open wound, right foot, subsequent encounter: Secondary | ICD-10-CM | POA: Insufficient documentation

## 2014-12-08 DIAGNOSIS — Y839 Surgical procedure, unspecified as the cause of abnormal reaction of the patient, or of later complication, without mention of misadventure at the time of the procedure: Secondary | ICD-10-CM | POA: Insufficient documentation

## 2014-12-08 DIAGNOSIS — L97524 Non-pressure chronic ulcer of other part of left foot with necrosis of bone: Secondary | ICD-10-CM | POA: Diagnosis not present

## 2014-12-08 DIAGNOSIS — T8189XD Other complications of procedures, not elsewhere classified, subsequent encounter: Secondary | ICD-10-CM | POA: Diagnosis not present

## 2014-12-13 ENCOUNTER — Non-Acute Institutional Stay (SKILLED_NURSING_FACILITY): Payer: Medicare Other | Admitting: Internal Medicine

## 2014-12-13 ENCOUNTER — Encounter: Payer: Self-pay | Admitting: Internal Medicine

## 2014-12-13 ENCOUNTER — Encounter: Payer: Self-pay | Admitting: Adult Health

## 2014-12-13 DIAGNOSIS — I10 Essential (primary) hypertension: Secondary | ICD-10-CM

## 2014-12-13 DIAGNOSIS — K219 Gastro-esophageal reflux disease without esophagitis: Secondary | ICD-10-CM

## 2014-12-13 DIAGNOSIS — F0150 Vascular dementia without behavioral disturbance: Secondary | ICD-10-CM | POA: Diagnosis not present

## 2014-12-13 DIAGNOSIS — E1159 Type 2 diabetes mellitus with other circulatory complications: Secondary | ICD-10-CM

## 2014-12-13 DIAGNOSIS — E1151 Type 2 diabetes mellitus with diabetic peripheral angiopathy without gangrene: Secondary | ICD-10-CM

## 2014-12-13 DIAGNOSIS — G20 Parkinson's disease: Secondary | ICD-10-CM

## 2014-12-13 NOTE — Progress Notes (Signed)
Patient ID: [name], female   DOB: December 30, 1921, 81 y.o.   MRN: [id number]  [name] living [place]     Allergies  Allergen Reactions  . Contrast Media [Iodinated Diagnostic Agents] Other (See Comments)    unknown  . Fish Allergy Other (See Comments)    Reaction unspecified on MAR  . Iodine Other (See Comments)    Reaction unspecified on MAR  . Shellfish Allergy Other (See Comments)    Reaction unspecified on [facility]       Chief Complaint  Patient presents with  . Medical Management of Chronic Issues    HPI:  She is a long term resident of this facility being seen for the management of her chronic illnesses. Overall there is little change in her status. She is unable to participate in the hpi or ros. There are no nursing concerns being voiced today.    Past Medical History  Diagnosis Date  . Fall   . FTT (failure to thrive) in adult   . Anemia   . Diabetes mellitus without complication   . Anxiety   . Glaucoma   . Acute bronchitis 11/22/2011  . Unspecified vitamin D deficiency 08/30/2011  . Vascular dementia, uncomplicated 02/26/2011  . Unspecified late effects of cerebrovascular disease 11/18/2008  . Reflux esophagitis 11/18/2008  . Ulcer of other part of foot 11/18/2008  . Shortness of breath 11/18/2008  . Paralysis agitans 11/03/2008  . Unspecified essential hypertension 11/03/2008  . Irritable bowel syndrome 11/03/2008  . Ulcer     right second toe    Past Surgical History  Procedure Laterality Date  . Amputation Right 08/25/2013    Procedure: AMPUTATION OF RIGHT 2ND TOE;  Surgeon: Sherren Kerns, MD;  Location: [facility] OR;  Service: Vascular;  Laterality: Right;    VITAL SIGNS BP 130/69 mmHg  Pulse 82  Ht  (1.626 m)  Wt 140 lb (63.504 kg)  BMI 24.02 kg/m2   Outpatient Encounter Prescriptions as of 11/14/2014  Medication Sig  . acetaminophen (TYLENOL) 500 MG tablet Take 1,000 mg by mouth 2 (two) times daily.  Marland Kitchen alum & mag hydroxide-simeth  (MAALOX/MYLANTA) 200-200-20 MG/5ML suspension Take 30 mLs by mouth every 6 (six) hours as needed for indigestion or heartburn.  Marland Kitchen amLODipine (NORVASC) 5 MG tablet Take 5 mg by mouth daily.  . carbidopa-levodopa (SINEMET IR) 25-100 MG per tablet Take 0.5 tablets by mouth 3 (three) times daily.   . Cholecalciferol 2000 UNITS CAPS Take 1 capsule by mouth daily.  . cloNIDine (CATAPRES) 0.1 MG tablet Take 0.1 mg by mouth daily as needed (sbp >180).  . dorzolamide-timolol (COSOPT) 22.3-6.8 MG/ML ophthalmic solution Place 1 drop into both eyes 2 (two) times daily.  . hydrocortisone cream 1 % Apply 1 application topically 4 (four) times daily. Right shoulder  . insulin aspart (NOVOLOG) 100 UNIT/ML injection Inject 5 Units into the skin 3 (three) times daily with meals. For cbg >=150 prior to meals  . insulin detemir (LEVEMIR) 100 UNIT/ML injection Inject 12 Units into the skin at bedtime.   . magnesium hydroxide (MILK OF MAGNESIA) 400 MG/5ML suspension Take 30 mLs by mouth daily as needed for mild constipation.  . Multiple Vitamin (MULTIVITAMIN) tablet Take 1 tablet by mouth daily.  . promethazine (PHENERGAN) 25 MG tablet Take 25 mg by mouth every 6 (six) hours as needed for nausea or vomiting.  . Silver (AQUACEL AG FOAM EX) Apply 1 application topically 3 (three) times a week. To right foot  . traZODone (  DESYREL) 25 mg TABS tablet Take 25 mg by mouth at bedtime.     SIGNIFICANT DIAGNOSTIC EXAMS    LABS REVIEWED:   08-26-14: wbc 8.3; hgb 9.6; hct 30.3; mcv 88.3; plt 301; glucose 122; bun 16; creat 0.61; k+4.2; na++138; liver normal albumin 3.0; chol 113; ldl 50; trig 69; hdl 49; tsh 1.686; hgb a1c 6.9; vit d 40     Review of Systems  Unable to perform ROS    Physical Exam  Constitutional: She appears well-developed and well-nourished. No distress.  Neck: Neck supple. No JVD present. No thyromegaly present.  Cardiovascular: Normal rate, regular rhythm and intact distal pulses.     Respiratory: Effort normal and breath sounds normal. No respiratory distress.  GI: Soft. Bowel sounds are normal. She exhibits no distension. There is no tenderness.  Musculoskeletal: She exhibits no edema.  Is able to move extremities  Status post right second toe amputation   Neurological: She is alert.  Skin: Skin is warm and dry. She is not diaphoretic.       ASSESSMENT/ PLAN:  1. Dysphagia: no signs of aspiration present will continue honey thick liquids  2. Hypertension: she is presently stable will continue norvasc 5 mg daily; and clonidine daily as needed for systolic reading >960>180  3. Diabetes: is on levemir 8 units nigh tl; humalong 5 units as needed for cbg >=150; her hgb a1c is 6.9; due to her advanced age and her low cbg readings will stop her insulin at this time and will monitor her status. If her cbg's rise will make further adjustments as indicated.   4. Parkinson disease: no significant change in her status; will continue sinemet 25/100 mg three times daily and will monitor her status.   5. Degenerative joint disease: no indications of pain present; will continue tylenol 1 gram three times daily and will monitor her status   6.  Vascular dementia: she is end stage; no significant in her status; is presently not on medications; does take trazodone 50 mg nightly for sleep; will not make changes will monitor her stauts.     Synthia Innocenteborah Aeson Sawyers NP [facility]  Contact [phone] Monday through Friday 8am- 5pm  After hours call [phone]

## 2014-12-15 DIAGNOSIS — E11621 Type 2 diabetes mellitus with foot ulcer: Secondary | ICD-10-CM | POA: Diagnosis not present

## 2014-12-15 DIAGNOSIS — T8189XD Other complications of procedures, not elsewhere classified, subsequent encounter: Secondary | ICD-10-CM | POA: Diagnosis not present

## 2014-12-15 DIAGNOSIS — L97524 Non-pressure chronic ulcer of other part of left foot with necrosis of bone: Secondary | ICD-10-CM | POA: Diagnosis not present

## 2014-12-15 DIAGNOSIS — S91301D Unspecified open wound, right foot, subsequent encounter: Secondary | ICD-10-CM | POA: Diagnosis not present

## 2014-12-15 LAB — GLUCOSE, CAPILLARY: GLUCOSE-CAPILLARY: 300 mg/dL — AB (ref 70–99)

## 2014-12-22 DIAGNOSIS — T8189XD Other complications of procedures, not elsewhere classified, subsequent encounter: Secondary | ICD-10-CM | POA: Diagnosis not present

## 2014-12-22 DIAGNOSIS — E11621 Type 2 diabetes mellitus with foot ulcer: Secondary | ICD-10-CM | POA: Diagnosis not present

## 2014-12-22 DIAGNOSIS — S91301D Unspecified open wound, right foot, subsequent encounter: Secondary | ICD-10-CM | POA: Diagnosis not present

## 2014-12-22 DIAGNOSIS — L97524 Non-pressure chronic ulcer of other part of left foot with necrosis of bone: Secondary | ICD-10-CM | POA: Diagnosis not present

## 2014-12-22 LAB — GLUCOSE, CAPILLARY: GLUCOSE-CAPILLARY: 415 mg/dL — AB (ref 70–99)

## 2014-12-23 ENCOUNTER — Non-Acute Institutional Stay (SKILLED_NURSING_FACILITY): Payer: Medicare Other | Admitting: Adult Health

## 2014-12-23 DIAGNOSIS — I7025 Atherosclerosis of native arteries of other extremities with ulceration: Secondary | ICD-10-CM

## 2014-12-23 DIAGNOSIS — I10 Essential (primary) hypertension: Secondary | ICD-10-CM | POA: Diagnosis not present

## 2014-12-23 DIAGNOSIS — L98499 Non-pressure chronic ulcer of skin of other sites with unspecified severity: Secondary | ICD-10-CM

## 2014-12-23 DIAGNOSIS — E1159 Type 2 diabetes mellitus with other circulatory complications: Secondary | ICD-10-CM

## 2014-12-23 DIAGNOSIS — E1151 Type 2 diabetes mellitus with diabetic peripheral angiopathy without gangrene: Secondary | ICD-10-CM | POA: Diagnosis not present

## 2014-12-23 DIAGNOSIS — R1310 Dysphagia, unspecified: Secondary | ICD-10-CM | POA: Diagnosis not present

## 2014-12-23 DIAGNOSIS — M159 Polyosteoarthritis, unspecified: Secondary | ICD-10-CM

## 2014-12-23 DIAGNOSIS — I739 Peripheral vascular disease, unspecified: Secondary | ICD-10-CM

## 2014-12-23 DIAGNOSIS — M150 Primary generalized (osteo)arthritis: Secondary | ICD-10-CM

## 2014-12-23 DIAGNOSIS — F0150 Vascular dementia without behavioral disturbance: Secondary | ICD-10-CM

## 2014-12-23 DIAGNOSIS — G20 Parkinson's disease: Secondary | ICD-10-CM | POA: Diagnosis not present

## 2014-12-26 ENCOUNTER — Other Ambulatory Visit: Payer: Self-pay | Admitting: Internal Medicine

## 2014-12-26 LAB — CBC
HCT: 32.8 % — ABNORMAL LOW (ref 36.0–46.0)
HEMOGLOBIN: 10.8 g/dL — AB (ref 12.0–15.0)
MCH: 29.9 pg (ref 26.0–34.0)
MCHC: 32.9 g/dL (ref 30.0–36.0)
MCV: 90.9 fL (ref 78.0–100.0)
MPV: 9.9 fL (ref 8.6–12.4)
Platelets: 241 10*3/uL (ref 150–400)
RBC: 3.61 MIL/uL — ABNORMAL LOW (ref 3.87–5.11)
RDW: 15.3 % (ref 11.5–15.5)
WBC: 9.3 10*3/uL (ref 4.0–10.5)

## 2014-12-26 LAB — HEMOGLOBIN A1C
HEMOGLOBIN A1C: 8.7 % — AB (ref <5.7)
Mean Plasma Glucose: 203 mg/dL — ABNORMAL HIGH (ref <117)

## 2014-12-26 LAB — COMPREHENSIVE METABOLIC PANEL
ALT: 11 U/L (ref 0–35)
AST: 25 U/L (ref 0–37)
Albumin: 3.1 g/dL — ABNORMAL LOW (ref 3.5–5.2)
Alkaline Phosphatase: 139 U/L — ABNORMAL HIGH (ref 39–117)
BUN: 21 mg/dL (ref 6–23)
CHLORIDE: 104 meq/L (ref 96–112)
CO2: 25 mEq/L (ref 19–32)
CREATININE: 0.73 mg/dL (ref 0.50–1.10)
Calcium: 9.2 mg/dL (ref 8.4–10.5)
Glucose, Bld: 32 mg/dL — CL (ref 70–99)
Potassium: 4.2 mEq/L (ref 3.5–5.3)
Sodium: 140 mEq/L (ref 135–145)
Total Bilirubin: 0.3 mg/dL (ref 0.2–1.2)
Total Protein: 6.8 g/dL (ref 6.0–8.3)

## 2014-12-31 NOTE — Progress Notes (Signed)
Patient ID: [name], female   DOB: 08-04-1922, 81 y.o.   MRN: [id number]    [name]    [facility] of Service: SN[facility] (31)    Allergies  Allergen Reactions  . Contrast Media [Iodinated Diagnostic Agents] Other (See Comments)    unknown  . Fish Allergy Other (See Comments)    Reaction unspecified on MAR  . Iodine Other (See Comments)    Reaction unspecified on MAR  . Shellfish Allergy Other (See Comments)    [facility]    Chief Complaint  Patient presents with  . Medical Management of Chronic Issues    HPI:  81 yo female long term resident seen today for above. She c/o abdominal pain but cannot provide additional HPI due to her dementia. No nursing issues. Reduced po intake. CBG 170-350s.  CODE STATUS: DNR  Past Medical History  Diagnosis Date  . Fall   . FTT (failure to thrive) in adult   . Anemia   . Diabetes mellitus without complication   . Anxiety   . Glaucoma   . Acute bronchitis 11/22/2011  . Unspecified vitamin D deficiency 08/30/2011  . Vascular dementia, uncomplicated 02/26/2011  . Unspecified late effects of cerebrovascular disease 11/18/2008  . Reflux esophagitis 11/18/2008  . Ulcer of other part of foot 11/18/2008  . Shortness of breath 11/18/2008  . Paralysis agitans 11/03/2008  . Unspecified essential hypertension 11/03/2008  . Irritable bowel syndrome 11/03/2008  . Ulcer     right second toe     Medications: Patient's Medications  New Prescriptions   No medications on file  Previous Medications   ACETAMINOPHEN (TYLENOL) 500 MG TABLET    Take 1,000 mg by mouth 2 (two) times daily.   ALUM & MAG HYDROXIDE-SIMETH (MAALOX/MYLANTA) 200-200-20 MG/5ML SUSPENSION    Take 30 mLs by mouth every 6 (six) hours as needed for indigestion or heartburn.   AMLODIPINE (NORVASC) 5 MG TABLET    Take 5 mg by mouth daily.   CARBIDOPA-LEVODOPA (SINEMET IR) 25-100 MG PER TABLET    Take 0.5 tablets by mouth 3 (three)  times daily.    CHOLECALCIFEROL 2000 UNITS CAPS    Take 1 capsule by mouth daily.   CLONIDINE (CATAPRES) 0.1 MG TABLET    Take 0.1 mg by mouth daily as needed (sbp >180).   DORZOLAMIDE-TIMOLOL (COSOPT) 22.3-6.8 MG/ML OPHTHALMIC SOLUTION    Place 1 drop into both eyes 2 (two) times daily.   HYDROCORTISONE CREAM 1 %    Apply 1 application topically 4 (four) times daily. Right shoulder   MAGNESIUM HYDROXIDE (MILK OF MAGNESIA) 400 MG/5ML SUSPENSION    Take 30 mLs by mouth daily as needed for mild constipation.   MULTIPLE VITAMIN (MULTIVITAMIN) TABLET    Take 1 tablet by mouth daily.   PROMETHAZINE (PHENERGAN) 25 MG TABLET    Take 25 mg by mouth every 6 (six) hours as needed for nausea or vomiting.   SILVER (AQUACEL AG FOAM EX)    Apply 1 application topically 3 (three) times a week. To right foot   TRAZODONE (DESYREL) 25 MG TABS TABLET    Take 25 mg by mouth at bedtime.  Modified Medications   No medications on file  Discontinued Medications   No medications on file     Review of Systems  Unable to perform ROS: Dementia    Filed Vitals:   12/13/14 1700  BP: 128/77  Pulse: 88  Temp: 97.5 F (36.4 C)  Weight: 133  lb (60.328 kg)   Body mass index is 22.82 kg/(m^2).  Physical Exam  Constitutional:  Frail appearing  HENT:  Mouth/Throat: Oropharynx is clear and moist. No oropharyngeal exudate.  Eyes: Pupils are equal, round, and reactive to light. No scleral icterus.  Neck: Neck supple. No tracheal deviation present.  Cardiovascular: Normal rate, regular rhythm, normal heart sounds and intact distal pulses.  Exam reveals no gallop and no friction rub.   No murmur heard. No LE edema b/l  Pulmonary/Chest: Effort normal and breath sounds normal. No respiratory distress. She has no wheezes. She has no rales.  Abdominal: Soft. Bowel sounds are normal. She exhibits no distension and no mass. Tenderness: generally TTP. There is no rebound and no guarding.  Lymphadenopathy:    She has no  cervical adenopathy.  Neurological: She is alert. She displays tremor.  Skin: Skin is warm and dry. No rash noted.  Psychiatric: She has a normal mood and affect. Her behavior is normal.     Labs reviewed: Office Visit on 12/08/2014  Component Date Value Ref Range Status  . Glucose-Capillary 12/15/2014 300* 70 - 99 mg/dL Final  . Glucose-Capillary 12/22/2014 415* 70 - 99 mg/dL Final  . Comment 1 16/08/9603 DOCUMENTED IN CHART   Final  . Comment 2 12/22/2014 REPEAT TEST   Final     Assessment/Plan   ICD-9-CM ICD-10-CM   1. Gastroesophageal reflux disease, esophagitis presence not specified - uncontrolled 530.81 K21.9   2. Vascular dementia, without behavioral disturbance 290.40 F01.50   3. Essential hypertension controlled 401.9 I10   4. DM type 2 causing vascular disease - BS frequently elevated 250.70 E11.51    443.81    5. PARKINSON'S DISEASE - stable 332.0 G20    --Rx omeprazole  daily for reflux  --resume insulin tx lantus 10 units qhs; check CBGs qAM  --continue all other medications as ordered  --continue nutritional supplemnt BID  --will follow  Nicholi Ghuman S. Ancil Linsey  [facility] [address] [place], [place] [address] [phone] Office (Wednesdays and Fridays 8 AM - 5 PM) [phone] Cell (Monday-Friday 8 AM - 5 PM)

## 2015-01-05 ENCOUNTER — Encounter (HOSPITAL_BASED_OUTPATIENT_CLINIC_OR_DEPARTMENT_OTHER): Payer: Medicare Other | Attending: Internal Medicine

## 2015-01-05 DIAGNOSIS — E1151 Type 2 diabetes mellitus with diabetic peripheral angiopathy without gangrene: Secondary | ICD-10-CM | POA: Diagnosis not present

## 2015-01-05 DIAGNOSIS — E11621 Type 2 diabetes mellitus with foot ulcer: Secondary | ICD-10-CM | POA: Insufficient documentation

## 2015-01-05 DIAGNOSIS — L97524 Non-pressure chronic ulcer of other part of left foot with necrosis of bone: Secondary | ICD-10-CM | POA: Insufficient documentation

## 2015-01-05 DIAGNOSIS — F0390 Unspecified dementia without behavioral disturbance: Secondary | ICD-10-CM | POA: Diagnosis not present

## 2015-01-05 DIAGNOSIS — L97511 Non-pressure chronic ulcer of other part of right foot limited to breakdown of skin: Secondary | ICD-10-CM | POA: Insufficient documentation

## 2015-01-05 DIAGNOSIS — Z89421 Acquired absence of other right toe(s): Secondary | ICD-10-CM | POA: Diagnosis not present

## 2015-01-05 DIAGNOSIS — Z794 Long term (current) use of insulin: Secondary | ICD-10-CM | POA: Insufficient documentation

## 2015-01-06 ENCOUNTER — Non-Acute Institutional Stay (SKILLED_NURSING_FACILITY): Payer: Medicare Other | Admitting: Adult Health

## 2015-01-06 DIAGNOSIS — R1310 Dysphagia, unspecified: Secondary | ICD-10-CM | POA: Diagnosis not present

## 2015-01-06 DIAGNOSIS — I10 Essential (primary) hypertension: Secondary | ICD-10-CM | POA: Diagnosis not present

## 2015-01-06 DIAGNOSIS — E1159 Type 2 diabetes mellitus with other circulatory complications: Secondary | ICD-10-CM

## 2015-01-06 DIAGNOSIS — G20 Parkinson's disease: Secondary | ICD-10-CM

## 2015-01-06 DIAGNOSIS — M150 Primary generalized (osteo)arthritis: Secondary | ICD-10-CM

## 2015-01-06 DIAGNOSIS — F0150 Vascular dementia without behavioral disturbance: Secondary | ICD-10-CM | POA: Diagnosis not present

## 2015-01-06 DIAGNOSIS — E1151 Type 2 diabetes mellitus with diabetic peripheral angiopathy without gangrene: Secondary | ICD-10-CM

## 2015-01-06 DIAGNOSIS — M159 Polyosteoarthritis, unspecified: Secondary | ICD-10-CM

## 2015-01-10 ENCOUNTER — Encounter: Payer: Self-pay | Admitting: Adult Health

## 2015-01-10 MED ORDER — INSULIN LISPRO 100 UNIT/ML ~~LOC~~ SOLN: 5.0000 [IU] | Freq: Three times a day (TID) | SUBCUTANEOUS | Status: AC

## 2015-01-10 NOTE — Progress Notes (Signed)
Patient ID: [name], female   DOB: 1922/09/07, 81 y.o.   MRN: [id number]  [name] [place]     Allergies  Allergen Reactions  . Contrast Media [Iodinated Diagnostic Agents] Other (See Comments)    unknown  . Fish Allergy Other (See Comments)    Reaction unspecified on MAR  . Iodine Other (See Comments)    Reaction unspecified on MAR  . Shellfish Allergy Other (See Comments)    Reaction unspecified on [facility]       Chief Complaint  Patient presents with  . Acute Visit    concerns     HPI:  She is a long term resident of this facility being seen for the management of her chronic illnesses. Her cbg's remain elevated although she was restarted on her lantus; she will require further medication adjustment. She has lost weight her weight in Jan 2016 was 140 pounds her current weight is 131 pounds. There are reports of decreased appetite present.    Past Medical History  Diagnosis Date  . Fall   . FTT (failure to thrive) in adult   . Anemia   . Diabetes mellitus without complication   . Anxiety   . Glaucoma   . Acute bronchitis 11/22/2011  . Unspecified vitamin D deficiency 08/30/2011  . Vascular dementia, uncomplicated 02/26/2011  . Unspecified late effects of cerebrovascular disease 11/18/2008  . Reflux esophagitis 11/18/2008  . Ulcer of other part of foot 11/18/2008  . Shortness of breath 11/18/2008  . Paralysis agitans 11/03/2008  . Unspecified essential hypertension 11/03/2008  . Irritable bowel syndrome 11/03/2008  . Ulcer     right second toe    Past Surgical History  Procedure Laterality Date  . Amputation Right 08/25/2013    Procedure: AMPUTATION OF RIGHT 2ND TOE;  Surgeon: Sherren Kerns, MD;  Location: [facility] OR;  Service: Vascular;  Laterality: Right;    VITAL SIGNS BP 124/53 mmHg  Pulse 78  Ht  (1.575 m)  Wt 131 lb (59.421 kg)  BMI 23.95 kg/m2   Outpatient Encounter Prescriptions as of 12/23/2014  Medication Sig  . acetaminophen  (TYLENOL) 500 MG tablet Take 1,000 mg by mouth 2 (two) times daily.  Marland Kitchen alum & mag hydroxide-simeth (MAALOX/MYLANTA) 200-200-20 MG/5ML suspension Take 30 mLs by mouth every 6 (six) hours as needed for indigestion or heartburn.  Marland Kitchen amLODipine (NORVASC) 5 MG tablet Take 5 mg by mouth daily.  . carbidopa-levodopa (SINEMET IR) 25-100 MG per tablet Take 0.5 tablets by mouth 3 (three) times daily.   . Cholecalciferol 2000 UNITS CAPS Take 1 capsule by mouth daily.  . cloNIDine (CATAPRES) 0.1 MG tablet Take 0.1 mg by mouth daily as needed (sbp >180).  . dorzolamide-timolol (COSOPT) 22.3-6.8 MG/ML ophthalmic solution Place 1 drop into both eyes 2 (two) times daily.  . insulin glargine (LANTUS) 100 UNIT/ML injection Inject 10 Units into the skin at bedtime.  . magnesium hydroxide (MILK OF MAGNESIA) 400 MG/5ML suspension Take 30 mLs by mouth daily as needed for mild constipation.  . Multiple Vitamin (MULTIVITAMIN) tablet Take 1 tablet by mouth daily.  Marland Kitchen omeprazole (PRILOSEC) 20 MG capsule Take 20 mg by mouth daily.  . promethazine (PHENERGAN) 25 MG tablet Take 25 mg by mouth every 6 (six) hours as needed for nausea or vomiting.  . traZODone (DESYREL) 25 mg TABS tablet Take 50 mg by mouth at bedtime.      SIGNIFICANT DIAGNOSTIC EXAMS  LABS REVIEWED:   08-26-14: wbc 8.3; hgb 9.6; hct  30.3; mcv 88.3; plt 301; glucose 122; bun 16; creat 0.61; k+4.2; na++138; liver normal albumin 3.0; chol 113; ldl 50; trig 69; hdl 49; tsh 1.686; hgb a1c 6.9; vit d 40     ROS Unable to perform ROS  Physical Exam Constitutional: She appears well-developed and well-nourished. No distress.  Neck: Neck supple. No JVD present. No thyromegaly present.  Cardiovascular: Normal rate, regular rhythm and intact distal pulses.   Respiratory: Effort normal and breath sounds normal. No respiratory distress.  GI: Soft. Bowel sounds are normal. She exhibits no distension. There is no tenderness.  Musculoskeletal: She exhibits no  edema.  Is able to move extremities  Status post right second toe amputation   Neurological: She is alert.  Skin: Skin is warm and dry. She is not diaphoretic.     ASSESSMENT/ PLAN:  1. Dysphagia: no signs of aspiration present will continue honey thick liquids  2. Hypertension: she is presently stable will continue norvasc 5 mg daily; and clonidine daily as needed for systolic reading >621>180  3. Diabetes: she has been restarted on lantus 10 units nightly. Her cbg's remain elevated; will begin humalog 5 units proir to meals for cbg >=150; will continue to monitor her status.   4. Parkinson disease: no significant change in her status; will continue sinemet 25/100 mg three times daily and will monitor her status.   5. Degenerative joint disease: no indications of pain present; will continue tylenol 1 gram three times daily and will monitor her status   6.  Vascular dementia: she is end stage; no significant in her status; is presently not on medications; does take trazodone 50 mg nightly for sleep; will not make changes will monitor her status.   7. Weight loss: her weight in Jan 2016 was 140 pounds her current weight is 131 pounds; will being remeron 7.5 mg nightly for 30 days and will monitor her status.   8. Right foot wound: will continue current plan of care and is followed by wound care center will monitor     Will check cbc; cmp hgb a1c   Synthia Innocenteborah Cynitha Berte NP [facility]  Contact [phone] Monday through Friday 8am- 5pm  After hours call [phone]

## 2015-01-12 DIAGNOSIS — E1151 Type 2 diabetes mellitus with diabetic peripheral angiopathy without gangrene: Secondary | ICD-10-CM | POA: Diagnosis not present

## 2015-01-12 DIAGNOSIS — E11621 Type 2 diabetes mellitus with foot ulcer: Secondary | ICD-10-CM | POA: Diagnosis not present

## 2015-01-12 DIAGNOSIS — L97524 Non-pressure chronic ulcer of other part of left foot with necrosis of bone: Secondary | ICD-10-CM | POA: Diagnosis not present

## 2015-01-12 DIAGNOSIS — L97511 Non-pressure chronic ulcer of other part of right foot limited to breakdown of skin: Secondary | ICD-10-CM | POA: Diagnosis not present

## 2015-01-23 NOTE — Progress Notes (Signed)
Patient ID: [name], female   DOB: 22-Apr-1922, 81 y.o.   MRN: [id number]  [name] living [place]     Allergies  Allergen Reactions  . Contrast Media [Iodinated Diagnostic Agents] Other (See Comments)    unknown  . Fish Allergy Other (See Comments)    Reaction unspecified on MAR  . Iodine Other (See Comments)    Reaction unspecified on MAR  . Shellfish Allergy Other (See Comments)    Reaction unspecified on [facility]       Chief Complaint  Patient presents with  . Medical Management of Chronic Issues    HPI:  She is a long term resident of this facility being seen for the management of her chronic illnesses. Her cbg's remain elevated she is presently on lantus and humalog prior to meals for cbg >=150. She will need routine humalog with her meals. She is unable to participate in the hpi or ros.    Past Medical History  Diagnosis Date  . Fall   . FTT (failure to thrive) in adult   . Anemia   . Diabetes mellitus without complication   . Anxiety   . Glaucoma   . Acute bronchitis 11/22/2011  . Unspecified vitamin D deficiency 08/30/2011  . Vascular dementia, uncomplicated 92/09/9416  . Unspecified late effects of cerebrovascular disease 11/18/2008  . Reflux esophagitis 11/18/2008  . Ulcer of other part of foot 11/18/2008  . Shortness of breath 11/18/2008  . Paralysis agitans 11/03/2008  . Unspecified essential hypertension 11/03/2008  . Irritable bowel syndrome 11/03/2008  . Ulcer     right second toe    Past Surgical History  Procedure Laterality Date  . Amputation Right 08/25/2013    Procedure: AMPUTATION OF RIGHT 2ND TOE;  Surgeon: Elam Dutch, MD;  Location: [facility] OR;  Service: Vascular;  Laterality: Right;    VITAL SIGNS BP 105/64 mmHg  Pulse 76  Ht 5' 2"  (1.575 m)  Wt 131 lb (59.421 kg)  BMI 23.95 kg/m2   Outpatient Encounter Prescriptions as of 01/06/2015  Medication Sig  . acetaminophen (TYLENOL) 500 MG tablet Take 1,000 mg by mouth 2 (two)  times daily.  Marland Kitchen alum & mag hydroxide-simeth (MAALOX/MYLANTA) 200-200-20 MG/5ML suspension Take 30 mLs by mouth every 6 (six) hours as needed for indigestion or heartburn.  Marland Kitchen amLODipine (NORVASC) 5 MG tablet Take 5 mg by mouth daily.  . carbidopa-levodopa (SINEMET IR) 25-100 MG per tablet Take 0.5 tablets by mouth 3 (three) times daily.   . Cholecalciferol 2000 UNITS CAPS Take 1 capsule by mouth daily.  . cloNIDine (CATAPRES) 0.1 MG tablet Take 0.1 mg by mouth daily as needed (sbp >180).  . dorzolamide-timolol (COSOPT) 22.3-6.8 MG/ML ophthalmic solution Place 1 drop into both eyes 2 (two) times daily.  . insulin glargine (LANTUS) 100 UNIT/ML injection Inject 10 Units into the skin at bedtime.  . insulin lispro (HUMALOG) 100 UNIT/ML injection Inject 0.05 mLs (5 Units total) into the skin 3 (three) times daily before meals. For cbg >=150  . magnesium hydroxide (MILK OF MAGNESIA) 400 MG/5ML suspension Take 30 mLs by mouth daily as needed for mild constipation.  . Multiple Vitamin (MULTIVITAMIN) tablet Take 1 tablet by mouth daily.  Marland Kitchen omeprazole (PRILOSEC) 20 MG capsule Take 20 mg by mouth daily.  . promethazine (PHENERGAN) 25 MG tablet Take 25 mg by mouth every 6 (six) hours as needed for nausea or vomiting.  . traZODone (DESYREL) 25 mg TABS tablet Take 50 mg by mouth at bedtime.  SIGNIFICANT DIAGNOSTIC EXAMS   LABS REVIEWED:   08-26-14: wbc 8.3; hgb 9.6; hct 30.3; mcv 88.3; plt 301; glucose 122; bun 16; creat 0.61; k+4.2; na++138; liver normal albumin 3.0; chol 113; ldl 50; trig 69; hdl 49; tsh 1.686; hgb a1c 6.9; vit d 40  12-26-14: wbc 9.3; hgb 10.8; hct 32.8; mcv 90.9; plt 241; glucose 322; bun 21; creat 0.73; k+4.2; na++140; alk phos 139; albumin 3.1; hgb a1c 8.7        ROS Unable to perform ROS    Physical Exam Constitutional: She appears well-developed and well-nourished. No distress.  Neck: Neck supple. No JVD present. No thyromegaly present.  Cardiovascular: Normal rate,  regular rhythm and intact distal pulses.   Respiratory: Effort normal and breath sounds normal. No respiratory distress.  GI: Soft. Bowel sounds are normal. She exhibits no distension. There is no tenderness.  Musculoskeletal: She exhibits no edema.  Is able to move extremities  Status post right second toe amputation   Neurological: She is alert.  Skin: Skin is warm and dry. She is not diaphoretic.       ASSESSMENT/ PLAN:   1. Dysphagia: no signs of aspiration present will continue honey thick liquids  2. Hypertension: she is presently stable will continue norvasc 5 mg daily; and clonidine daily as needed for systolic reading >681  3. Diabetes: will continue  lantus 10 units nightly. Her cbg's remain elevated; will continue humalog 5 units proir to meals for cbg >=150; and will add humalog 2 units prior to all meals her hgb a1c is 8.7    4. Parkinson disease: no significant change in her status; will continue sinemet 25/100 mg three times daily and will monitor her status.   5. Degenerative joint disease: no indications of pain present; will continue tylenol 1 gram three times daily and will monitor her status   6.  Vascular dementia: she is end stage; no significant in her status; is presently not on medications; does take trazodone 50 mg nightly for sleep; will not make changes will monitor her status.   7. Weight loss: her weight in Jan 2016 was 140 pounds her current weight is 131 pounds; she is on  remeron 7.5 mg nightly through 01-22-15;  will monitor her status.   8. Right foot wound: will continue current plan of care and is followed by wound care center will monitor     Norma Edwards NP [facility]  Contact [phone] Monday through Friday 8am- 5pm  After hours call [phone]

## 2015-01-26 DIAGNOSIS — L97511 Non-pressure chronic ulcer of other part of right foot limited to breakdown of skin: Secondary | ICD-10-CM | POA: Diagnosis not present

## 2015-01-26 DIAGNOSIS — E1151 Type 2 diabetes mellitus with diabetic peripheral angiopathy without gangrene: Secondary | ICD-10-CM | POA: Diagnosis not present

## 2015-01-26 DIAGNOSIS — L97524 Non-pressure chronic ulcer of other part of left foot with necrosis of bone: Secondary | ICD-10-CM | POA: Diagnosis not present

## 2015-01-26 DIAGNOSIS — E11621 Type 2 diabetes mellitus with foot ulcer: Secondary | ICD-10-CM | POA: Diagnosis not present

## 2015-02-09 ENCOUNTER — Encounter (HOSPITAL_BASED_OUTPATIENT_CLINIC_OR_DEPARTMENT_OTHER): Payer: Medicare Other | Attending: Internal Medicine

## 2015-02-09 DIAGNOSIS — L97521 Non-pressure chronic ulcer of other part of left foot limited to breakdown of skin: Secondary | ICD-10-CM | POA: Insufficient documentation

## 2015-02-09 DIAGNOSIS — E11621 Type 2 diabetes mellitus with foot ulcer: Secondary | ICD-10-CM | POA: Diagnosis present

## 2015-02-09 DIAGNOSIS — I872 Venous insufficiency (chronic) (peripheral): Secondary | ICD-10-CM | POA: Insufficient documentation

## 2015-02-13 ENCOUNTER — Non-Acute Institutional Stay (SKILLED_NURSING_FACILITY): Payer: Medicare Other | Admitting: Adult Health

## 2015-02-13 DIAGNOSIS — F0150 Vascular dementia without behavioral disturbance: Secondary | ICD-10-CM

## 2015-02-13 DIAGNOSIS — E1151 Type 2 diabetes mellitus with diabetic peripheral angiopathy without gangrene: Secondary | ICD-10-CM

## 2015-02-13 DIAGNOSIS — I10 Essential (primary) hypertension: Secondary | ICD-10-CM | POA: Diagnosis not present

## 2015-02-13 DIAGNOSIS — R1310 Dysphagia, unspecified: Secondary | ICD-10-CM | POA: Diagnosis not present

## 2015-02-13 DIAGNOSIS — G20 Parkinson's disease: Secondary | ICD-10-CM | POA: Diagnosis not present

## 2015-02-13 DIAGNOSIS — E1159 Type 2 diabetes mellitus with other circulatory complications: Secondary | ICD-10-CM

## 2015-02-15 ENCOUNTER — Encounter: Payer: Self-pay | Admitting: Vascular Surgery

## 2015-02-15 ENCOUNTER — Ambulatory Visit (INDEPENDENT_AMBULATORY_CARE_PROVIDER_SITE_OTHER): Payer: Medicare Other | Admitting: Vascular Surgery

## 2015-02-15 VITALS — BP 122/58 | HR 74 | Resp 16 | Ht 60.0 in | Wt 131.0 lb

## 2015-02-15 DIAGNOSIS — I70269 Atherosclerosis of native arteries of extremities with gangrene, unspecified extremity: Secondary | ICD-10-CM | POA: Diagnosis not present

## 2015-02-15 NOTE — Progress Notes (Signed)
Patient is an 81 year old female status post amputation of her right second toe October of 2014. This took several months to heal. She returns today for evaluation of gangrenous changes of toes 1 and 2 left foot. She does not complain of pain. Apparently there was some drainage from the toes earlier but this has now dried up. She is currently being followed by Dr. Gordy Levanobeson at the wound center. She is demented. Her daughter was present for the office visit today.  Physical exam: Filed Vitals:   02/15/15 1351  BP: 122/58  Pulse: 74  Resp: 16  Height: 5' (1.524 m)  Weight: 131 lb (59.421 kg)      left lower extremity: Dry gangrenous changes toes 1 and 2 left foot no erythema no drainage  Assessment: Dry gangrene toes 1 and 2 left foot no evidence of ascending infection or significant pain  Plan: The patient and family have been informed that if the wound does not heal she would require an above-knee amputation.  I instructed the patient's daughter to continue local wound care. He would be at high risk for toe amputation alone and would most likely require an above-knee amputation. I would only consider this for worsening pain infection as a palliative operation only.  The patient will follow-up on as-needed basis if she has continued deterioration of the toes with ascending infection or intolerable pain.  Fabienne Brunsharles Bryana Froemming, MD [facility] Office: [phone] Pager: [phone]

## 2015-02-16 ENCOUNTER — Ambulatory Visit: Payer: Medicare Other | Admitting: Vascular Surgery

## 2015-02-21 ENCOUNTER — Encounter: Payer: Medicare Other | Admitting: Vascular Surgery

## 2015-02-23 DIAGNOSIS — E11621 Type 2 diabetes mellitus with foot ulcer: Secondary | ICD-10-CM | POA: Diagnosis not present

## 2015-02-23 DIAGNOSIS — I872 Venous insufficiency (chronic) (peripheral): Secondary | ICD-10-CM | POA: Diagnosis not present

## 2015-02-23 DIAGNOSIS — L97521 Non-pressure chronic ulcer of other part of left foot limited to breakdown of skin: Secondary | ICD-10-CM | POA: Diagnosis not present

## 2015-02-23 LAB — GLUCOSE, CAPILLARY: GLUCOSE-CAPILLARY: 222 mg/dL — AB (ref 70–99)

## 2015-03-13 ENCOUNTER — Non-Acute Institutional Stay (SKILLED_NURSING_FACILITY): Payer: Medicare Other | Admitting: Adult Health

## 2015-03-13 DIAGNOSIS — F0150 Vascular dementia without behavioral disturbance: Secondary | ICD-10-CM

## 2015-03-13 DIAGNOSIS — M150 Primary generalized (osteo)arthritis: Secondary | ICD-10-CM | POA: Diagnosis not present

## 2015-03-13 DIAGNOSIS — E1151 Type 2 diabetes mellitus with diabetic peripheral angiopathy without gangrene: Secondary | ICD-10-CM

## 2015-03-13 DIAGNOSIS — G20 Parkinson's disease: Secondary | ICD-10-CM | POA: Diagnosis not present

## 2015-03-13 DIAGNOSIS — I10 Essential (primary) hypertension: Secondary | ICD-10-CM | POA: Diagnosis not present

## 2015-03-13 DIAGNOSIS — R1310 Dysphagia, unspecified: Secondary | ICD-10-CM

## 2015-03-13 DIAGNOSIS — M159 Polyosteoarthritis, unspecified: Secondary | ICD-10-CM

## 2015-03-13 DIAGNOSIS — E1159 Type 2 diabetes mellitus with other circulatory complications: Secondary | ICD-10-CM

## 2015-03-28 ENCOUNTER — Encounter: Payer: Self-pay | Admitting: Internal Medicine

## 2015-03-28 ENCOUNTER — Non-Acute Institutional Stay (SKILLED_NURSING_FACILITY): Payer: Medicare Other | Admitting: Internal Medicine

## 2015-03-28 DIAGNOSIS — F0150 Vascular dementia without behavioral disturbance: Secondary | ICD-10-CM

## 2015-03-28 DIAGNOSIS — G20 Parkinson's disease: Secondary | ICD-10-CM | POA: Diagnosis not present

## 2015-03-28 DIAGNOSIS — F0280 Dementia in other diseases classified elsewhere without behavioral disturbance: Secondary | ICD-10-CM | POA: Diagnosis not present

## 2015-03-28 DIAGNOSIS — R404 Transient alteration of awareness: Secondary | ICD-10-CM | POA: Diagnosis not present

## 2015-03-28 DIAGNOSIS — J189 Pneumonia, unspecified organism: Secondary | ICD-10-CM

## 2015-03-28 NOTE — Progress Notes (Signed)
Patient ID: [name], female   DOB: [birth year], 81 y.o.   MRN: [id number]    DATE: 03/28/15  [facility]    Place of Service: SNF [phone])   Extended Emergency Contact Information Primary Emergency Contact: [facility],Patricia Address: [address] [place]           [place], [place] [address] [place] Home Phone: [phone] Mobile Phone: [phone] Relation: Daughter Secondary Emergency Contact: Laurice RecordWalton,John Michael Address: [address]          [place], [place] [address] [place] Home Phone: [phone] Relation: Grandson  Advanced Directive information  DNR  Chief Complaint  Patient presents with  . Acute Visit    HPI:  81 yo female long term resident seen today for an acute visit.  She is sleeping and not easily aroused.  Nursing notes reduced po intake and she has been steadily declining in last several  weeks. She has sinemet for parkinson's. She was started on levaquin last week for LLL pneumonia that was unresolved.  She is also taking floraster. CBG 112 today. She takes lantus and humalog for diabetes. She is a poor historian due to dementia. Hx obtained from nursing and chart  Past Medical History  Diagnosis Date  . Fall   . FTT (failure to thrive) in adult   . Anemia   . Diabetes mellitus without complication   . Anxiety   . Glaucoma   . Acute bronchitis 11/22/2011  . Unspecified vitamin D deficiency 08/30/2011  . Vascular dementia, uncomplicated 02/26/2011  . Unspecified late effects of cerebrovascular disease 11/18/2008  . Reflux esophagitis 11/18/2008  . Ulcer of other part of foot 11/18/2008  . Shortness of breath 11/18/2008  . Paralysis agitans 11/03/2008  . Unspecified essential hypertension 11/03/2008  . Irritable bowel syndrome 11/03/2008  . Ulcer     right second toe    Past Surgical History  Procedure Laterality Date  . Amputation Right 08/25/2013    Procedure: AMPUTATION  OF RIGHT 2ND TOE;  Surgeon: Sherren Kernsharles E Fields, MD;  [facility];  Service: Vascular;  Laterality: Right;    Patient Care Team: Kirt BoysMonica Meah Jiron, DO as PCP - General (Internal Medicine) Sharee Holstereborah S Green, NP as Nurse Practitioner (Nurse Practitioner)  History   Social History  . Marital Status: Single    Spouse Name: N/A  . Number of Children: N/A  . Years of Education: N/A   Occupational History  . Not on file.   Social History Main Topics  . Smoking status: Never Smoker   . Smokeless tobacco: Never Used  . Alcohol Use: No  . Drug Use: No  . Sexual Activity: No   Other Topics Concern  . Not on file   Social History Narrative     reports that she has never smoked. She has never used smokeless tobacco. She reports that she does not drink alcohol or use illicit drugs.  Immunization History  Administered Date(s) Administered  . Influenza Whole 09/12/2000, 08/07/2012  . Influenza-Unspecified 08/17/2014  . PPD Test 01/24/2011    Allergies  Allergen Reactions  . Contrast Media [Iodinated Diagnostic Agents] Other (See Comments)    unknown  . Fish Allergy Other (See Comments)    Reaction unspecified on MAR  . Iodine Other (See Comments)    Reaction unspecified on MAR  . Shellfish Allergy Other (See Comments)    Reaction unspecified on MAR    Medications: Patient's Medications  New Prescriptions  No medications on file  Previous Medications   ACETAMINOPHEN (TYLENOL) 500 MG TABLET    Take 1,000 mg by mouth 2 (two) times daily.   ALUM & MAG HYDROXIDE-SIMETH (MAALOX/MYLANTA) 200-200-20 MG/5ML SUSPENSION    Take 30 mLs by mouth every 6 (six) hours as needed for indigestion or heartburn.   AMLODIPINE (NORVASC) 5 MG TABLET    Take 5 mg by mouth daily.   CARBIDOPA-LEVODOPA (SINEMET IR) 25-100 MG PER TABLET    Take 0.5 tablets by mouth 3 (three) times daily.    CHOLECALCIFEROL 2000 UNITS CAPS    Take 1 capsule by mouth daily.   CLONIDINE (CATAPRES) 0.1 MG TABLET    Take 0.1  mg by mouth daily as needed (sbp >180).   DORZOLAMIDE-TIMOLOL (COSOPT) 22.3-6.8 MG/ML OPHTHALMIC SOLUTION    Place 1 drop into both eyes 2 (two) times daily.   INSULIN GLARGINE (LANTUS) 100 UNIT/ML INJECTION    Inject 10 Units into the skin at bedtime.   INSULIN LISPRO (HUMALOG) 100 UNIT/ML INJECTION    Inject 0.05 mLs (5 Units total) into the skin 3 (three) times daily before meals. For cbg >=150   MAGNESIUM HYDROXIDE (MILK OF MAGNESIA) 400 MG/5ML SUSPENSION    Take 30 mLs by mouth daily as needed for mild constipation.   MULTIPLE VITAMIN (MULTIVITAMIN) TABLET    Take 1 tablet by mouth daily.   OMEPRAZOLE (PRILOSEC) 20 MG CAPSULE    Take 20 mg by mouth daily.   PROMETHAZINE (PHENERGAN) 25 MG TABLET    Take 25 mg by mouth every 6 (six) hours as needed for nausea or vomiting.   TRAZODONE (DESYREL) 25 MG TABS TABLET    Take 50 mg by mouth at bedtime.   Modified Medications   No medications on file  Discontinued Medications   No medications on file    Review of Systems  Unable to perform ROS: Dementia    Filed Vitals:   03/28/15 1634  BP: 100/68  Pulse: 82  Temp: 96.1 F (35.6 C)  Weight: 129 lb (58.514 kg)  SpO2: 91%   Body mass index is 25.19 kg/(m^2).  Physical Exam  Constitutional: She appears lethargic. She is sleeping. She has a sickly appearance.  nonverbal  HENT:  Refused to open mouth  Eyes:  Refused to open eyes  Neck: Neck supple. No tracheal deviation present.  Cardiovascular: Normal rate, regular rhythm, normal heart sounds and intact distal pulses.  Exam reveals no gallop and no friction rub.   No murmur heard. No LE edema b/l. no calf TTP. No carotid bruit b/l  Pulmonary/Chest: No accessory muscle usage or stridor. No respiratory distress. She has no wheezes. She has rhonchi. She has no rales.  Abdominal: Soft. Bowel sounds are normal. She exhibits no distension and no mass. There is no tenderness. There is no rebound and no guarding.  Musculoskeletal:  Right  foot dressing c/d/i  Lymphadenopathy:    She has no cervical adenopathy.  Neurological: She appears lethargic.  Skin: Skin is warm and dry. No rash noted.  Psychiatric: She has a normal mood and affect.  unable to assess as pt is sleeping     Labs reviewed: Office Visit on 02/09/2015  Component Date Value Ref Range Status  . Glucose-Capillary 02/23/2015 222* 70 - 99 mg/dL Final    No results found. CXR 03/22/15 revealed b/l basilar atelecatsis and improved LLL infiltrate  Assessment/Plan    ICD-9-CM ICD-10-CM   1. Transient alteration of awareness 780.02 R40.4  2. Vascular dementia, without behavioral disturbance 290.40 F01.50   3. Parkinson's disease dementia 332.0 G20    294.10 F02.80   4. HCAP (healthcare-associated pneumonia) 486 J18.9    LLL     --complete abx for total 7 days  --continue floraster for 10 day total  --consult palliative care  --continue other medications as ordered  --will follow. Encourage po intake  Addalie Calles S. Ancil Linsey  [facility] [address] [place], [place] [address] [phone] Cell (Monday-Friday 8 AM - 5 PM) [phone] After 5 PM and follow prompts

## 2015-04-05 DEATH — deceased

## 2015-04-24 NOTE — Progress Notes (Signed)
Patient ID: [name], female   DOB: 1921-11-09, 81 y.o.   MRN: [id number]  [name] [place]     Allergies  Allergen Reactions  . Contrast Media [Iodinated Diagnostic Agents] Other (See Comments)    unknown  . Fish Allergy Other (See Comments)    Reaction unspecified on MAR  . Iodine Other (See Comments)    Reaction unspecified on MAR  . Shellfish Allergy Other (See Comments)    Reaction unspecified on [facility]       Chief Complaint  Patient presents with  . Medical Management of Chronic Issues    HPI:  She is a long term resident of this facility being seen for the management of her chronic illnesses. Overall her status is without significant change. She is unable to participate in the hpi or ros. There are no nursing concerns at this time.    Past Medical History  Diagnosis Date  . Fall   . FTT (failure to thrive) in adult   . Anemia   . Diabetes mellitus without complication   . Anxiety   . Glaucoma   . Acute bronchitis 11/22/2011  . Unspecified vitamin D deficiency 08/30/2011  . Vascular dementia, uncomplicated 85/46/2703  . Unspecified late effects of cerebrovascular disease 11/18/2008  . Reflux esophagitis 11/18/2008  . Ulcer of other part of foot 11/18/2008  . Shortness of breath 11/18/2008  . Paralysis agitans 11/03/2008  . Unspecified essential hypertension 11/03/2008  . Irritable bowel syndrome 11/03/2008  . Ulcer     right second toe    Past Surgical History  Procedure Laterality Date  . Amputation Right 08/25/2013    Procedure: AMPUTATION OF RIGHT 2ND TOE;  Surgeon: Elam Dutch, MD;  Location: [facility] OR;  Service: Vascular;  Laterality: Right;    VITAL SIGNS BP 102/56 mmHg  Pulse 86  Ht 5' 2"  (1.575 m)  Wt 131 lb (59.421 kg)  BMI 23.95 kg/m2   Outpatient Encounter Prescriptions as of 02/13/2015  Medication Sig  . acetaminophen (TYLENOL) 500 MG tablet Take 1,000 mg by mouth 2 (two) times daily.  Marland Kitchen alum & mag hydroxide-simeth  (MAALOX/MYLANTA) 200-200-20 MG/5ML suspension Take 30 mLs by mouth every 6 (six) hours as needed for indigestion or heartburn.  Marland Kitchen amLODipine (NORVASC) 5 MG tablet Take 5 mg by mouth daily.  . carbidopa-levodopa (SINEMET IR) 25-100 MG per tablet Take 0.5 tablets by mouth 3 (three) times daily.   . Cholecalciferol 2000 UNITS CAPS Take 1 capsule by mouth daily.  . cloNIDine (CATAPRES) 0.1 MG tablet Take 0.1 mg by mouth daily as needed (sbp >180).  . dorzolamide-timolol (COSOPT) 22.3-6.8 MG/ML ophthalmic solution Place 1 drop into both eyes 2 (two) times daily.  . insulin glargine (LANTUS) 100 UNIT/ML injection Inject 10 Units into the skin at bedtime.  . insulin lispro (HUMALOG) 100 UNIT/ML injection Inject 0.05 mLs (5 Units total) into the skin 3 (three) times daily before meals. For cbg >=150  . magnesium hydroxide (MILK OF MAGNESIA) 400 MG/5ML suspension Take 30 mLs by mouth daily as needed for mild constipation.  . Multiple Vitamin (MULTIVITAMIN) tablet Take 1 tablet by mouth daily.  Marland Kitchen omeprazole (PRILOSEC) 20 MG capsule Take 20 mg by mouth daily.  . promethazine (PHENERGAN) 25 MG tablet Take 25 mg by mouth every 6 (six) hours as needed for nausea or vomiting.  . traZODone (DESYREL) 25 mg TABS tablet Take 50 mg by mouth at bedtime.    No facility-administered encounter medications on file as of 02/13/2015.  SIGNIFICANT DIAGNOSTIC EXAMS   LABS REVIEWED:   08-26-14: wbc 8.3; hgb 9.6; hct 30.3; mcv 88.3; plt 301; glucose 122; bun 16; creat 0.61; k+4.2; na++138; liver normal albumin 3.0; chol 113; ldl 50; trig 69; hdl 49; tsh 1.686; hgb a1c 6.9; vit d 40  12-26-14: wbc 9.3; hgb 10.8; hct 32.8; mcv 90.9; plt 241; glucose 322; bun 21; creat 0.73; k+4.2; na++140; alk phos 139; albumin 3.1; hgb a1c 8.7     ROS Unable to perform ROS    Physical Exam Constitutional: She appears well-developed and well-nourished. No distress.  Neck: Neck supple. No JVD present. No thyromegaly present.    Cardiovascular: Normal rate, regular rhythm and intact distal pulses.   Respiratory: Effort normal and breath sounds normal. No respiratory distress.  GI: Soft. Bowel sounds are normal. She exhibits no distension. There is no tenderness.  Musculoskeletal: She exhibits no edema.  Is able to move extremities  Status post right second toe amputation Left great toe wound per wound clinic    Neurological: She is alert.  Skin: Skin is warm and dry. She is not diaphoretic.     ASSESSMENT/ PLAN:  1. Dysphagia: no signs of aspiration present will continue honey thick liquids  2. Hypertension: she is presently stable will continue norvasc 5 mg daily; and clonidine daily as needed for systolic reading >383  3. Diabetes: will continue  lantus 10 units nightly. Her cbg's remain elevated; will continue humalog 5 units proir to meals for cbg >=150; and will add humalog 2 units prior to all meals her hgb a1c is 8.7    4. Parkinson disease: no significant change in her status; will continue sinemet 25/100 mg three times daily and will monitor her status.   5. Degenerative joint disease: no indications of pain present; will continue tylenol 1 gram three times daily and will monitor her status   6.  Vascular dementia: she is end stage; no significant in her status; is presently not on medications; does take trazodone 50 mg nightly for sleep; will not make changes will monitor her status.   7. Weight loss: her weight in Jan 2016 was 140 pounds her current weight is 131 pounds; she is on  remeron 7.5 mg nightly through 01-22-15;  will monitor her status.   8. Right foot wound: will continue current plan of care and is followed by wound care center will monitor     Ok Edwards NP [facility]  Contact [phone] Monday through Friday 8am- 5pm  After hours call [phone]

## 2015-05-07 NOTE — Progress Notes (Signed)
Patient ID: [name], female   DOB: 1922/02/17, 81 y.o.   MRN: [id number]  [name]      Allergies  Allergen Reactions  . Contrast Media [Iodinated Diagnostic Agents] Other (See Comments)    unknown  . Fish Allergy Other (See Comments)    Reaction unspecified on MAR  . Iodine Other (See Comments)    Reaction unspecified on MAR  . Shellfish Allergy Other (See Comments)    Reaction unspecified on [facility]       Chief Complaint  Patient presents with  . Medical Management of Chronic Issues    HPI:  She is a long term resident of this facility being seen for the management of her chronic illnesses. overall there is little change in her status. Her weight has remained the same. She is followed at the wound center for the left foot ulcerations. She is unable to participate in the hpi and ros. There are nursing concerns at this time.    Past Medical History  Diagnosis Date  . Fall   . FTT (failure to thrive) in adult   . Anemia   . Diabetes mellitus without complication   . Anxiety   . Glaucoma   . Acute bronchitis 11/22/2011  . Unspecified vitamin D deficiency 08/30/2011  . Vascular dementia, uncomplicated 29/56/2130  . Unspecified late effects of cerebrovascular disease 11/18/2008  . Reflux esophagitis 11/18/2008  . Ulcer of other part of foot 11/18/2008  . Shortness of breath 11/18/2008  . Paralysis agitans 11/03/2008  . Unspecified essential hypertension 11/03/2008  . Irritable bowel syndrome 11/03/2008  . Ulcer     right second toe    Past Surgical History  Procedure Laterality Date  . Amputation Right 08/25/2013    Procedure: AMPUTATION OF RIGHT 2ND TOE;  Surgeon: Elam Dutch, MD;  Location: [facility] OR;  Service: Vascular;  Laterality: Right;    VITAL SIGNS BP 112/68 mmHg  Pulse 90  Ht 5' 2"  (1.575 m)  Wt 131 lb (59.421 kg)  BMI 23.95 kg/m2   Outpatient Encounter Prescriptions as of 03/13/2015  Medication Sig  . acetaminophen (TYLENOL)  500 MG tablet Take 1,000 mg by mouth 2 (two) times daily.  Marland Kitchen alum & mag hydroxide-simeth (MAALOX/MYLANTA) 200-200-20 MG/5ML suspension Take 30 mLs by mouth every 6 (six) hours as needed for indigestion or heartburn.  Marland Kitchen amLODipine (NORVASC) 5 MG tablet Take 5 mg by mouth daily.  . carbidopa-levodopa (SINEMET IR) 25-100 MG per tablet Take 0.5 tablets by mouth 3 (three) times daily.   . Cholecalciferol 2000 UNITS CAPS Take 1 capsule by mouth daily.  . cloNIDine (CATAPRES) 0.1 MG tablet Take 0.1 mg by mouth daily as needed (sbp >180).  . dorzolamide-timolol (COSOPT) 22.3-6.8 MG/ML ophthalmic solution Place 1 drop into both eyes 2 (two) times daily.  . insulin glargine (LANTUS) 100 UNIT/ML injection Inject 10 Units into the skin at bedtime.  . insulin lispro (HUMALOG) 100 UNIT/ML injection Inject 0.05 mLs (5 Units total) into the skin 3 (three) times daily before meals. For cbg >=150  . magnesium hydroxide (MILK OF MAGNESIA) 400 MG/5ML suspension Take 30 mLs by mouth daily as needed for mild constipation.  . Multiple Vitamin (MULTIVITAMIN) tablet Take 1 tablet by mouth daily.  Marland Kitchen omeprazole (PRILOSEC) 20 MG capsule Take 20 mg by mouth daily.  . promethazine (PHENERGAN) 25 MG tablet Take 25 mg by mouth every 6 (six) hours as needed for nausea or vomiting.  . traZODone (DESYREL) 25 mg TABS tablet Take  50 mg by mouth at bedtime.       SIGNIFICANT DIAGNOSTIC EXAMS  LABS REVIEWED:   08-26-14: wbc 8.3; hgb 9.6; hct 30.3; mcv 88.3; plt 301; glucose 122; bun 16; creat 0.61; k+4.2; na++138; liver normal albumin 3.0; chol 113; ldl 50; trig 69; hdl 49; tsh 1.686; hgb a1c 6.9; vit d 40  12-26-14: wbc 9.3; hgb 10.8; hct 32.8; mcv 90.9; plt 241; glucose 322; bun 21; creat 0.73; k+4.2; na++140; alk phos 139; albumin 3.1; hgb a1c 8.7      Review of Systems  Unable to perform ROS    Physical Exam Constitutional: She appears well-developed and well-nourished. No distress.  Neck: Neck supple. No JVD  present. No thyromegaly present.  Cardiovascular: Normal rate, regular rhythm and intact distal pulses.   Respiratory: Effort normal and breath sounds normal. No respiratory distress.  GI: Soft. Bowel sounds are normal. She exhibits no distension. There is no tenderness.  Musculoskeletal: She exhibits no edema.  Is able to move extremities  Status post right second toe amputation Left great toe wound 2.5 x 2.4 x 0.3 cm  per wound clinic Left second 0.9 x 1.0 cm     Neurological: She is alert.  Skin: Skin is warm and dry. She is not diaphoretic.       ASSESSMENT/ PLAN:  1. Dysphagia: no signs of aspiration present will continue honey thick liquids  2. Hypertension: she is presently stable will continue norvasc 5 mg daily; and clonidine daily as needed for systolic reading >546  3. Diabetes: will continue  lantus 10 units nightly. Her cbg's remain elevated; will continue humalog 5 units proir to meals for cbg >=150; and  humalog 2 units prior to all meals her hgb a1c is 8.7    4. Parkinson disease: no significant change in her status; will continue sinemet 25/100 mg three times daily and will monitor her status.   5. Degenerative joint disease: no indications of pain present; will continue tylenol 1 gram three times daily and will monitor her status   6.  Vascular dementia: she is end stage; no significant in her status; is presently not on medications; does take trazodone 50 mg nightly for sleep; will not make changes will monitor her status.   7. Weight loss: her weight in Jan 2016 was 140 pounds her current weight is 131 pounds; she is no longer on  Remeron;  There is no change in her weight   will monitor her status.   8. Right foot wound: will continue current plan of care and is followed by wound care center will monitor   Will check bmp and hgb a1c    Ok Edwards NP [facility]  Contact [phone] Monday through Friday 8am- 5pm  After hours call  [phone]
